# Patient Record
Sex: Male | Born: 1955 | Race: White | Hispanic: No | Marital: Single | Smoking: Current every day smoker
Health system: Southern US, Community
[De-identification: ages and names within clinical notes are randomized; demographics above are authoritative.]

## PROBLEM LIST (undated history)

## (undated) DIAGNOSIS — G4700 Insomnia, unspecified: Secondary | ICD-10-CM

## (undated) DIAGNOSIS — F419 Anxiety disorder, unspecified: Secondary | ICD-10-CM

## (undated) DIAGNOSIS — Z720 Tobacco use: Secondary | ICD-10-CM

## (undated) DIAGNOSIS — K219 Gastro-esophageal reflux disease without esophagitis: Secondary | ICD-10-CM

## (undated) DIAGNOSIS — F329 Major depressive disorder, single episode, unspecified: Secondary | ICD-10-CM

## (undated) DIAGNOSIS — K648 Other hemorrhoids: Secondary | ICD-10-CM

## (undated) DIAGNOSIS — F32A Depression, unspecified: Secondary | ICD-10-CM

## (undated) DIAGNOSIS — J439 Emphysema, unspecified: Secondary | ICD-10-CM

## (undated) DIAGNOSIS — F1411 Cocaine abuse, in remission: Secondary | ICD-10-CM

## (undated) DIAGNOSIS — I700 Atherosclerosis of aorta: Secondary | ICD-10-CM

## (undated) DIAGNOSIS — J449 Chronic obstructive pulmonary disease, unspecified: Secondary | ICD-10-CM

## (undated) DIAGNOSIS — D126 Benign neoplasm of colon, unspecified: Secondary | ICD-10-CM

## (undated) DIAGNOSIS — E785 Hyperlipidemia, unspecified: Secondary | ICD-10-CM

## (undated) DIAGNOSIS — M1990 Unspecified osteoarthritis, unspecified site: Secondary | ICD-10-CM

## (undated) DIAGNOSIS — Z860102 Personal history of hyperplastic colon polyps: Secondary | ICD-10-CM

## (undated) DIAGNOSIS — K5900 Constipation, unspecified: Secondary | ICD-10-CM

## (undated) DIAGNOSIS — N529 Male erectile dysfunction, unspecified: Secondary | ICD-10-CM

## (undated) HISTORY — DX: Hyperlipidemia, unspecified: E78.5

## (undated) HISTORY — DX: Emphysema, unspecified: J43.9

## (undated) HISTORY — DX: Benign neoplasm of colon, unspecified: D12.6

## (undated) HISTORY — DX: Tobacco use: Z72.0

## (undated) HISTORY — PX: LEG SURGERY: SHX1003

## (undated) HISTORY — DX: Personal history of hyperplastic colon polyps: Z86.0102

## (undated) HISTORY — DX: Chronic obstructive pulmonary disease, unspecified: J44.9

## (undated) HISTORY — DX: Depression, unspecified: F32.A

## (undated) HISTORY — DX: Anxiety disorder, unspecified: F41.9

## (undated) HISTORY — DX: Atherosclerosis of aorta: I70.0

## (undated) HISTORY — PX: OTHER SURGICAL HISTORY: SHX169

## (undated) HISTORY — DX: Major depressive disorder, single episode, unspecified: F32.9

## (undated) HISTORY — DX: Male erectile dysfunction, unspecified: N52.9

## (undated) HISTORY — DX: Insomnia, unspecified: G47.00

## (undated) HISTORY — DX: Cocaine abuse, in remission: F14.11

---

## 1999-07-12 HISTORY — PX: APPENDECTOMY: SHX54

## 2010-08-01 ENCOUNTER — Encounter: Payer: Self-pay | Admitting: Family Medicine

## 2014-03-26 ENCOUNTER — Encounter (HOSPITAL_BASED_OUTPATIENT_CLINIC_OR_DEPARTMENT_OTHER): Payer: Self-pay | Admitting: *Deleted

## 2014-03-26 ENCOUNTER — Other Ambulatory Visit (INDEPENDENT_AMBULATORY_CARE_PROVIDER_SITE_OTHER): Payer: Self-pay | Admitting: General Surgery

## 2014-03-28 ENCOUNTER — Encounter (HOSPITAL_BASED_OUTPATIENT_CLINIC_OR_DEPARTMENT_OTHER): Payer: Self-pay | Admitting: *Deleted

## 2014-03-28 NOTE — Progress Notes (Signed)
NPO AFTER MN. ARRIVE AT 0930. NEEDS HG. MAY TAKE TRAMADOL IF NEEDED AM DOS W/ SIPS OF WATER.

## 2014-04-02 ENCOUNTER — Ambulatory Visit (HOSPITAL_BASED_OUTPATIENT_CLINIC_OR_DEPARTMENT_OTHER)
Admission: RE | Admit: 2014-04-02 | Discharge: 2014-04-02 | Disposition: A | Discharge status: Home / Self Care | Payer: Managed Care, Other (non HMO) | Source: Ambulatory Visit | Attending: General Surgery | Admitting: General Surgery

## 2014-04-02 ENCOUNTER — Encounter (HOSPITAL_BASED_OUTPATIENT_CLINIC_OR_DEPARTMENT_OTHER): Payer: Self-pay | Admitting: Certified Registered"

## 2014-04-02 ENCOUNTER — Encounter (HOSPITAL_BASED_OUTPATIENT_CLINIC_OR_DEPARTMENT_OTHER): Payer: Managed Care, Other (non HMO) | Admitting: Anesthesiology

## 2014-04-02 ENCOUNTER — Encounter (HOSPITAL_BASED_OUTPATIENT_CLINIC_OR_DEPARTMENT_OTHER)
Admission: RE | Disposition: A | Discharge status: Home / Self Care | Payer: Self-pay | Source: Ambulatory Visit | Attending: General Surgery

## 2014-04-02 ENCOUNTER — Ambulatory Visit (HOSPITAL_BASED_OUTPATIENT_CLINIC_OR_DEPARTMENT_OTHER): Payer: Managed Care, Other (non HMO) | Admitting: Anesthesiology

## 2014-04-02 DIAGNOSIS — F17200 Nicotine dependence, unspecified, uncomplicated: Secondary | ICD-10-CM | POA: Insufficient documentation

## 2014-04-02 DIAGNOSIS — K648 Other hemorrhoids: Secondary | ICD-10-CM | POA: Insufficient documentation

## 2014-04-02 HISTORY — DX: Constipation, unspecified: K59.00

## 2014-04-02 HISTORY — DX: Unspecified osteoarthritis, unspecified site: M19.90

## 2014-04-02 HISTORY — DX: Gastro-esophageal reflux disease without esophagitis: K21.9

## 2014-04-02 HISTORY — PX: HEMORRHOID SURGERY: SHX153

## 2014-04-02 HISTORY — DX: Other hemorrhoids: K64.8

## 2014-04-02 LAB — POCT HEMOGLOBIN-HEMACUE: Hemoglobin: 17.5 g/dL — ABNORMAL HIGH (ref 13.0–17.0)

## 2014-04-02 SURGERY — HEMORRHOIDECTOMY
Anesthesia: Monitor Anesthesia Care | Site: Rectum

## 2014-04-02 MED ORDER — SODIUM CHLORIDE 0.9 % IJ SOLN
3.0000 mL | Freq: Two times a day (BID) | INTRAMUSCULAR | Status: DC
  Filled 2014-04-02: qty 3

## 2014-04-02 MED ORDER — LACTATED RINGERS IV SOLN
INTRAVENOUS | Status: DC
  Administered 2014-04-02 (×2): via INTRAVENOUS
  Filled 2014-04-02: qty 1000

## 2014-04-02 MED ORDER — OXYCODONE HCL 5 MG PO TABS
5.0000 mg | ORAL_TABLET | ORAL | Status: DC | PRN
  Filled 2014-04-02: qty 2

## 2014-04-02 MED ORDER — PROMETHAZINE HCL 25 MG/ML IJ SOLN
6.2500 mg | INTRAMUSCULAR | Status: DC | PRN
  Filled 2014-04-02: qty 1

## 2014-04-02 MED ORDER — PROPOFOL 10 MG/ML IV EMUL
INTRAVENOUS | Status: DC | PRN
  Administered 2014-04-02: 200 ug/kg/min via INTRAVENOUS

## 2014-04-02 MED ORDER — KETAMINE HCL 50 MG/ML IJ SOLN
INTRAMUSCULAR | Status: AC
  Filled 2014-04-02: qty 10

## 2014-04-02 MED ORDER — FENTANYL CITRATE 0.05 MG/ML IJ SOLN
INTRAMUSCULAR | Status: AC
  Filled 2014-04-02: qty 6

## 2014-04-02 MED ORDER — MIDAZOLAM HCL 2 MG/2ML IJ SOLN
INTRAMUSCULAR | Status: AC
  Filled 2014-04-02: qty 2

## 2014-04-02 MED ORDER — MIDAZOLAM HCL 5 MG/5ML IJ SOLN
INTRAMUSCULAR | Status: DC | PRN
  Administered 2014-04-02: 2 mg via INTRAVENOUS

## 2014-04-02 MED ORDER — BUPIVACAINE-EPINEPHRINE 0.5% -1:200000 IJ SOLN
INTRAMUSCULAR | Status: DC | PRN
  Administered 2014-04-02: 10 mL

## 2014-04-02 MED ORDER — FENTANYL CITRATE 0.05 MG/ML IJ SOLN
25.0000 ug | INTRAMUSCULAR | Status: DC | PRN
  Filled 2014-04-02: qty 1

## 2014-04-02 MED ORDER — SODIUM CHLORIDE 0.9 % IJ SOLN
3.0000 mL | INTRAMUSCULAR | Status: DC | PRN
  Filled 2014-04-02: qty 3

## 2014-04-02 MED ORDER — ACETAMINOPHEN 650 MG RE SUPP
650.0000 mg | RECTAL | Status: DC | PRN
  Filled 2014-04-02: qty 1

## 2014-04-02 MED ORDER — KETAMINE HCL 100 MG/ML IJ SOLN
250.0000 mg | INTRAMUSCULAR | Status: DC | PRN
  Administered 2014-04-02: 15 ug/kg/min via INTRAVENOUS

## 2014-04-02 MED ORDER — SODIUM CHLORIDE 0.9 % IV SOLN
250.0000 mL | INTRAVENOUS | Status: DC | PRN
  Filled 2014-04-02: qty 250

## 2014-04-02 MED ORDER — KETOROLAC TROMETHAMINE 30 MG/ML IJ SOLN
15.0000 mg | Freq: Once | INTRAMUSCULAR | Status: DC | PRN
  Filled 2014-04-02: qty 1

## 2014-04-02 MED ORDER — FENTANYL CITRATE 0.05 MG/ML IJ SOLN
INTRAMUSCULAR | Status: DC | PRN
  Administered 2014-04-02: 50 ug via INTRAVENOUS
  Administered 2014-04-02: 25 ug via INTRAVENOUS

## 2014-04-02 MED ORDER — SODIUM CHLORIDE 0.9 % IR SOLN
Status: DC | PRN
  Administered 2014-04-02: 500 mL

## 2014-04-02 MED ORDER — ACETAMINOPHEN 325 MG PO TABS
650.0000 mg | ORAL_TABLET | ORAL | Status: DC | PRN
  Filled 2014-04-02: qty 2

## 2014-04-02 MED ORDER — OXYCODONE-ACETAMINOPHEN 5-325 MG PO TABS: 1.0000 | ORAL_TABLET | ORAL | Status: DC | PRN

## 2014-04-02 MED ORDER — LIDOCAINE HCL (CARDIAC) 20 MG/ML IV SOLN
INTRAVENOUS | Status: DC | PRN
  Administered 2014-04-02: 50 mg via INTRAVENOUS

## 2014-04-02 SURGICAL SUPPLY — 38 items
BLADE HEX COATED 2.75 (ELECTRODE) IMPLANT
BLADE SURG 10 STRL SS (BLADE) ×2 IMPLANT
BRIEF STRETCH FOR OB PAD LRG (UNDERPADS AND DIAPERS) IMPLANT
CLOTH BEACON ORANGE TIMEOUT ST (SAFETY) ×2 IMPLANT
COVER MAYO STAND STRL (DRAPES) ×2 IMPLANT
COVER TABLE BACK 60X90 (DRAPES) ×2 IMPLANT
DRAPE PED LAPAROTOMY (DRAPES) ×2 IMPLANT
DRAPE UTILITY XL STRL (DRAPES) ×2 IMPLANT
DRSG PAD ABDOMINAL 8X10 ST (GAUZE/BANDAGES/DRESSINGS) ×2 IMPLANT
ELECT BLADE 6.5 .24CM SHAFT (ELECTRODE) ×2 IMPLANT
ELECT REM PT RETURN 9FT ADLT (ELECTROSURGICAL) ×2
ELECTRODE REM PT RTRN 9FT ADLT (ELECTROSURGICAL) ×1 IMPLANT
GAUZE SPONGE 4X4 16PLY XRAY LF (GAUZE/BANDAGES/DRESSINGS) ×2 IMPLANT
GLOVE BIO SURGEON STRL SZ 6.5 (GLOVE) ×4 IMPLANT
GLOVE INDICATOR 7.0 STRL GRN (GLOVE) ×2 IMPLANT
GOWN STRL REUS W/TWL 2XL LVL3 (GOWN DISPOSABLE) ×2 IMPLANT
NEEDLE HYPO 22GX1.5 SAFETY (NEEDLE) ×2 IMPLANT
NEEDLE HYPO 25X1 1.5 SAFETY (NEEDLE) ×2 IMPLANT
NS IRRIG 500ML POUR BTL (IV SOLUTION) ×2 IMPLANT
PACK BASIN DAY SURGERY FS (CUSTOM PROCEDURE TRAY) ×2 IMPLANT
PAD ABD 8X10 STRL (GAUZE/BANDAGES/DRESSINGS) IMPLANT
PENCIL BUTTON HOLSTER BLD 10FT (ELECTRODE) ×2 IMPLANT
SPONGE GAUZE 4X4 12PLY (GAUZE/BANDAGES/DRESSINGS) ×2 IMPLANT
SPONGE GAUZE 4X4 12PLY STER LF (GAUZE/BANDAGES/DRESSINGS) ×2 IMPLANT
SPONGE SURGIFOAM ABS GEL 100 (HEMOSTASIS) IMPLANT
SPONGE SURGIFOAM ABS GEL 12-7 (HEMOSTASIS) IMPLANT
STAPLER VISISTAT 35W (STAPLE) ×2 IMPLANT
SUT CHROMIC 2 0 SH (SUTURE) ×4 IMPLANT
SUT GUT CHROMIC 3 0 (SUTURE) IMPLANT
SUT PROLENE 2 0 BLUE (SUTURE) IMPLANT
SUT VIC AB 4-0 P-3 18XBRD (SUTURE) IMPLANT
SUT VIC AB 4-0 P3 18 (SUTURE)
SUT VIC AB 4-0 SH 18 (SUTURE) IMPLANT
SYR CONTROL 10ML LL (SYRINGE) ×2 IMPLANT
TRAY DSU PREP LF (CUSTOM PROCEDURE TRAY) ×2 IMPLANT
TUBE CONNECTING 12X1/4 (SUCTIONS) ×2 IMPLANT
WATER STERILE IRR 500ML POUR (IV SOLUTION) IMPLANT
YANKAUER SUCT BULB TIP NO VENT (SUCTIONS) ×2 IMPLANT

## 2014-04-02 NOTE — Anesthesia Postprocedure Evaluation (Signed)
  Anesthesia Post-op Note  Patient: Mitchell Dean  Procedure(s) Performed: Procedure(s) (LRB): EXCISION OF INTERNAL HEMORRHOIDS (N/A)  Patient Location: PACU  Anesthesia Type: General  Level of Consciousness: awake and alert   Airway and Oxygen Therapy: Patient Spontanous Breathing  Post-op Pain: mild  Post-op Assessment: Post-op Vital signs reviewed, Patient's Cardiovascular Status Stable, Respiratory Function Stable, Patent Airway and No signs of Nausea or vomiting  Last Vitals:  Filed Vitals:   04/02/14 1145  BP: 119/94  Pulse: 86  Temp:   Resp: 10    Post-op Vital Signs: stable   Complications: No apparent anesthesia complications

## 2014-04-02 NOTE — Anesthesia Preprocedure Evaluation (Signed)
Anesthesia Evaluation  Patient identified by MRN, date of birth, ID band Patient awake    Reviewed: Allergy & Precautions, H&P , NPO status , Patient's Chart, lab work & pertinent test results  Airway Mallampati: II TM Distance: >3 FB Neck ROM: Full    Dental no notable dental hx.    Pulmonary Current Smoker,  breath sounds clear to auscultation  Pulmonary exam normal       Cardiovascular negative cardio ROS  Rhythm:Regular Rate:Normal     Neuro/Psych negative neurological ROS  negative psych ROS   GI/Hepatic negative GI ROS, Neg liver ROS,   Endo/Other  negative endocrine ROS  Renal/GU negative Renal ROS  negative genitourinary   Musculoskeletal negative musculoskeletal ROS (+)   Abdominal   Peds negative pediatric ROS (+)  Hematology negative hematology ROS (+)   Anesthesia Other Findings   Reproductive/Obstetrics negative OB ROS                           Anesthesia Physical Anesthesia Plan  ASA: II  Anesthesia Plan: MAC   Post-op Pain Management:    Induction: Intravenous  Airway Management Planned: Simple Face Mask  Additional Equipment:   Intra-op Plan:   Post-operative Plan:   Informed Consent: I have reviewed the patients History and Physical, chart, labs and discussed the procedure including the risks, benefits and alternatives for the proposed anesthesia with the patient or authorized representative who has indicated his/her understanding and acceptance.   Dental advisory given  Plan Discussed with: CRNA and Surgeon  Anesthesia Plan Comments:         Anesthesia Quick Evaluation

## 2014-04-02 NOTE — Addendum Note (Signed)
Addendum created 04/02/14 1313 by Suan Halter, CRNA   Modules edited: Charges VN

## 2014-04-02 NOTE — Discharge Instructions (Addendum)
ANORECTAL SURGERY: POST OP INSTRUCTIONS °1. Take your usually prescribed home medications unless otherwise directed. °2. DIET: During the first few hours after surgery sip on some liquids until you are able to urinate.  It is normal to not urinate for several hours after this surgery.  If you feel uncomfortable, please contact the office for instructions.  After you are able to urinate,you may eat, if you feel like it.  Follow a light bland diet the first 24 hours after arrival home, such as soup, liquids, crackers, etc.  Be sure to include lots of fluids daily (6-8 glasses).  Avoid fast food or heavy meals, as your are more likely to get nauseated.  Eat a low fat diet the next few days after surgery.  Limit caffeine intake to 1-2 servings a day. °3. PAIN CONTROL: °a. Pain is best controlled by a usual combination of several different methods TOGETHER: °i. Muscle relaxation: Soak in a warm bath (or Sitz bath) three times a day and after bowel movements.  Continue to do this until all pain is resolved. °ii. Over the counter pain medication °iii. Prescription pain medication °b. Most patients will experience some swelling and discomfort in the anus/rectal area and incisions.  Heat such as warm towels, sitz baths, warm baths, etc to help relax tight/sore spots and speed recovery.  Some people prefer to use ice, especially in the first couple days after surgery, as it may decrease the pain and swelling, or alternate between ice & heat.  Experiment to what works for you.  Swelling and bruising can take several weeks to resolve.  Pain can take even longer to completely resolve. °c. It is helpful to take an over-the-counter pain medication regularly for the first few weeks.  Choose one of the following that works best for you: °i. Naproxen (Aleve, etc)  Two 220mg tabs twice a day °ii. Ibuprofen (Advil, etc) Three 200mg tabs four times a day (every meal & bedtime) °d. A  prescription for pain medication (such as percocet,  oxycodone, hydrocodone, etc) should be given to you upon discharge.  Take your pain medication as prescribed.  °i. If you are having problems/concerns with the prescription medicine (does not control pain, nausea, vomiting, rash, itching, etc), please call us [phone] to see if we need to switch you to a different pain medicine that will work better for you and/or control your side effect better. °ii. If you need a refill on your pain medication, please contact your pharmacy.  They will contact our office to request authorization. Prescriptions will not be filled after 5 pm or on week-ends. °4. KEEP YOUR BOWELS REGULAR and AVOID CONSTIPATION °a. The goal is one to two soft bowel movements a day.  You should at least have a bowel movement every other day. °b. Avoid getting constipated.  Between the surgery and the pain medications, it is common to experience some constipation. This can be very painful after rectal surgery.  Increasing fluid intake and taking a fiber supplement (such as Metamucil, Citrucel, FiberCon, etc) 1-2 times a day regularly will usually help prevent this problem from occurring.  A stool softener like colace is also recommended.  This can be purchased over the counter at your pharmacy.  You can take it up to 3 times a day.  If you do not have a bowel movement after 24 hrs since your surgery, take one does of milk of magnesia.  If you still haven't had a bowel movement 8-12 hours after   that dose, take another dose.  If you don't have a bowel movement 48 hrs after surgery, purchase a Fleets enema from the drug store and administer gently per package instructions.  If you still are having trouble with your bowel movements after that, please call the office for further instructions. °c. If you develop diarrhea or have many loose bowel movements, simplify your diet to bland foods & liquids for a few days.  Stop any stool softeners and decrease your fiber supplement.  Switching to mild  anti-diarrheal medications (Kayopectate, Pepto Bismol) can help.  If this worsens or does not improve, please call us. ° °5. Wound Care °a. Remove your bandages before your first bowel movement or 8 hours after surgery.     °b. Remove any wound packing material at this tim,e as well.  You do not need to repack the wound unless instructed otherwise.  Wear an absorbent pad or soft cotton gauze in your underwear to catch any drainage and help keep the area clean. You should change this every 2-3 hours while awake. °c. Keep the area clean and dry.  Bathe / shower every day, especially after bowel movements.  Keep the area clean by showering / bathing over the incision / wound.   It is okay to soak an open wound to help wash it.  Wet wipes or showers / gentle washing after bowel movements is often less traumatic than regular toilet paper. °d. You may have some styrofoam-like soft packing in the rectum which will come out with the first bowel movement.  °e. You will often notice bleeding with bowel movements.  This should slow down by the end of the first week of surgery °f. Expect some drainage.  This should slow down, too, by the end of the first week of surgery.  Wear an absorbent pad or soft cotton gauze in your underwear until the drainage stops. °g. Do Not sit on a rubber or pillow ring.  This can make you symptoms worse.  You may sit on a soft pillow if needed.  °6. ACTIVITIES as tolerated:   °a. You may resume regular (light) daily activities beginning the next day--such as daily self-care, walking, climbing stairs--gradually increasing activities as tolerated.  If you can walk 30 minutes without difficulty, it is safe to try more intense activity such as jogging, treadmill, bicycling, low-impact aerobics, swimming, etc. °b. Save the most intensive and strenuous activity for last such as sit-ups, heavy lifting, contact sports, etc  Refrain from any heavy lifting or straining until you are off narcotics for pain  control.   °c. You may drive when you are no longer taking prescription pain medication, you can comfortably sit for long periods of time, and you can safely maneuver your car and apply brakes. °d. You may have sexual intercourse when it is comfortable.  °7. FOLLOW UP in our office °a. Please call [facility] at [phone] to set up an appointment to see your surgeon in the office for a follow-up appointment approximately 3-4 weeks after your surgery. °b. Make sure that you call for this appointment the day you arrive home to insure a convenient appointment time. °10. IF YOU HAVE DISABILITY OR FAMILY LEAVE FORMS, BRING THEM TO THE OFFICE FOR PROCESSING.  DO NOT GIVE THEM TO YOUR DOCTOR. ° ° ° ° °WHEN TO CALL US [phone]: °1. Poor pain control °2. Reactions / problems with new medications (rash/itching, nausea, etc)  °3. Fever over 101.5 F (38.5 C) °4.   Inability to urinate 5. Nausea and/or vomiting 6. Worsening swelling or bruising 7. Continued bleeding from incision. 8. Increased pain, redness, or drainage from the incision  The clinic staff is available to answer your questions during regular business hours (8:30am-5pm).  Please dont hesitate to call and ask to speak to one of our nurses for clinical concerns.   A surgeon from [facility] is always on call at the hospitals   If you have a medical emergency, go to the nearest emergency room or call 911.    [facility], [place], [place], [place], [place]  [address] ? MAIN: [phone] ? TOLL FREE: [phone] ? FAX [phone] [email or web address]  Information for Discharge Teaching: EXPAREL (bupivacaine liposome injectable suspension)   Your surgeon gave you EXPAREL(bupivacaine) in your surgical incision to help control your pain after surgery.   EXPAREL is a local anesthetic that provides pain relief by numbing the tissue around the surgical site.  EXPAREL is designed to release  pain medication over time and can control pain for up to 72 hours.  Depending on how you respond to EXPAREL, you may require less pain medication during your recovery.  Possible side effects:  Temporary loss of sensation or ability to move in the area where bupivacaine was injected.  Nausea, vomiting, constipation  Rarely, numbness and tingling in your mouth or lips, lightheadedness, or anxiety may occur.  Call your doctor right away if you think you may be experiencing any of these sensations, or if you have other questions regarding possible side effects.  Follow all other discharge instructions given to you by your surgeon or nurse. Eat a healthy diet and drink plenty of water or other fluids.  If you return to the hospital for any reason within 96 hours following the administration of EXPAREL, please inform your health care providers.    Post Anesthesia Home Care Instructions  Activity: Get plenty of rest for the remainder of the day. A responsible adult should stay with you for 24 hours following the procedure.  For the next 24 hours, DO NOT: -Drive a car -Paediatric nurse -Drink alcoholic beverages -Take any medication unless instructed by your physician -Make any legal decisions or sign important papers.  Meals: Start with liquid foods such as gelatin or soup. Progress to regular foods as tolerated. Avoid greasy, spicy, heavy foods. If nausea and/or vomiting occur, drink only clear liquids until the nausea and/or vomiting subsides. Call your physician if vomiting continues.  Special Instructions/Symptoms: Your throat may feel dry or sore from the anesthesia or the breathing tube placed in your throat during surgery. If this causes discomfort, gargle with warm salt water. The discomfort should disappear within 24 hours.

## 2014-04-02 NOTE — Op Note (Signed)
04/02/2014  11:27 AM  PATIENT:  Mitchell Dean  57 y.o. male  Patient Care Team: Reginia Naas, MD as PCP - General (Family Medicine)  PRE-OPERATIVE DIAGNOSIS:  grade 3 internal hemorrhoid  POST-OPERATIVE DIAGNOSIS:  grade 3 internal hemorrhoid  PROCEDURE:  ANAL EXAM UNDER ANESTHESIA, SINGLE COLUMN HEMORRHOID EXCISION  SURGEON:  Surgeon(s): Leighton Ruff, MD  ASSISTANT: none   ANESTHESIA:   local and MAC  SPECIMEN:  Source of Specimen:  R posterior hemorrhoid  DISPOSITION OF SPECIMEN:  PATHOLOGY  COUNTS:  YES  PLAN OF CARE: Discharge to home after PACU  PATIENT DISPOSITION:  PACU - hemodynamically stable.  INDICATION: This is a 58 year old male who presented to my office with acute pain and a grade 3 prolapsed internal hemorrhoid. This was associated with a moderate-sized fibrotic skin tag.  I recommended excision under anesthesia. The risk and benefits of the procedure were explained. He agreed and consent was signed and placed on the chart prior to the OR.   OR FINDINGS: Grade 3 prolapsed internal hemorrhoid with external skin tag component. Grade 1 left lateral internal hemorrhoid, grade 2 right anterior internal hemorrhoid  DESCRIPTION: the patient was identified in the preoperative holding area and taken to the OR where they were laid on the operating room table.  MAC anesthesia was induced without difficulty. The patient was then positioned in prone jackknife position with buttocks gently taped apart.  The patient was then prepped and draped in usual sterile fashion.  SCDs were noted to be in place prior to the initiation of anesthesia. A surgical timeout was performed indicating the correct patient, procedure, positioning and need for preoperative antibiotics.  A rectal block was performed using 0.5% Marcaine with epinephrine.  I began with a digital rectal exam.  There were no palpable masses. There were no areas of thrombosis.  I then placed a Hill-Ferguson  anoscope into the anal canal and evaluated this completely.  A grade 1 left lateral internal hemorrhoid was identified. A grade 2 right anterior internal hemorrhoid was identified. These did not appear to be inflamed. A grade 3 right posterior hemorrhoid was identified with an external skin tag associated with this. I used Metzenbaum scissors to divide the skin down to the level of the dentate line making sure to dissect away any muscle fibers of the sphincter. I then placed a Kelly clamp around the internal component of the internal hemorrhoid and excised the hemorrhoid using a 10 blade scalpel.  This was sent to pathology for further examination.  I used a 2-0 chromic suture in a running fashion to close the internal component of the incision. After this was completed I used a 3-0 running chromic suture to close the external component of the incision (distal to the dentate line). Hemostasis was good. I then performed a rectal block using Exparel. A sterile dressing was applied. The patient was awakened from anesthesia and sent to the postanesthesia care unit in stable condition. All counts were correct per operating room staff.

## 2014-04-02 NOTE — Transfer of Care (Signed)
Immediate Anesthesia Transfer of Care Note  Patient: Mitchell Dean  Procedure(s) Performed: Procedure(s) (LRB): EXCISION OF INTERNAL HEMORRHOIDS (N/A)  Patient Location: PACU  Anesthesia Type: MAC  Level of Consciousness: awake, alert , oriented and patient cooperative  Airway & Oxygen Therapy: Patient Spontanous Breathing and Patient connected to face mask oxygen  Post-op Assessment: Report given to PACU RN and Post -op Vital signs reviewed and stable  Post vital signs: Reviewed and stable  Complications: No apparent anesthesia complications

## 2014-04-03 ENCOUNTER — Encounter (HOSPITAL_BASED_OUTPATIENT_CLINIC_OR_DEPARTMENT_OTHER): Payer: Self-pay | Admitting: General Surgery

## 2016-07-14 DIAGNOSIS — H6982 Other specified disorders of Eustachian tube, left ear: Secondary | ICD-10-CM | POA: Diagnosis not present

## 2016-08-02 DIAGNOSIS — H6982 Other specified disorders of Eustachian tube, left ear: Secondary | ICD-10-CM | POA: Diagnosis not present

## 2016-08-03 ENCOUNTER — Encounter (HOSPITAL_COMMUNITY): Payer: Self-pay | Admitting: Emergency Medicine

## 2016-08-03 ENCOUNTER — Observation Stay (HOSPITAL_COMMUNITY)
Admission: EM | Admit: 2016-08-03 | Discharge: 2016-08-05 | Disposition: A | Discharge status: Home / Self Care | Payer: BLUE CROSS/BLUE SHIELD | Attending: Family Medicine | Admitting: Family Medicine

## 2016-08-03 ENCOUNTER — Emergency Department (HOSPITAL_COMMUNITY): Payer: BLUE CROSS/BLUE SHIELD

## 2016-08-03 DIAGNOSIS — J441 Chronic obstructive pulmonary disease with (acute) exacerbation: Principal | ICD-10-CM | POA: Insufficient documentation

## 2016-08-03 DIAGNOSIS — F17210 Nicotine dependence, cigarettes, uncomplicated: Secondary | ICD-10-CM | POA: Insufficient documentation

## 2016-08-03 DIAGNOSIS — Z91041 Radiographic dye allergy status: Secondary | ICD-10-CM | POA: Insufficient documentation

## 2016-08-03 DIAGNOSIS — M19042 Primary osteoarthritis, left hand: Secondary | ICD-10-CM | POA: Diagnosis not present

## 2016-08-03 DIAGNOSIS — Z825 Family history of asthma and other chronic lower respiratory diseases: Secondary | ICD-10-CM | POA: Insufficient documentation

## 2016-08-03 DIAGNOSIS — G8929 Other chronic pain: Secondary | ICD-10-CM | POA: Diagnosis not present

## 2016-08-03 DIAGNOSIS — R0602 Shortness of breath: Secondary | ICD-10-CM | POA: Diagnosis not present

## 2016-08-03 DIAGNOSIS — M19041 Primary osteoarthritis, right hand: Secondary | ICD-10-CM | POA: Insufficient documentation

## 2016-08-03 DIAGNOSIS — F17200 Nicotine dependence, unspecified, uncomplicated: Secondary | ICD-10-CM

## 2016-08-03 DIAGNOSIS — K219 Gastro-esophageal reflux disease without esophagitis: Secondary | ICD-10-CM | POA: Diagnosis not present

## 2016-08-03 DIAGNOSIS — F419 Anxiety disorder, unspecified: Secondary | ICD-10-CM | POA: Insufficient documentation

## 2016-08-03 DIAGNOSIS — F329 Major depressive disorder, single episode, unspecified: Secondary | ICD-10-CM | POA: Diagnosis not present

## 2016-08-03 DIAGNOSIS — M549 Dorsalgia, unspecified: Secondary | ICD-10-CM | POA: Diagnosis not present

## 2016-08-03 DIAGNOSIS — R0902 Hypoxemia: Secondary | ICD-10-CM | POA: Diagnosis not present

## 2016-08-03 DIAGNOSIS — K5900 Constipation, unspecified: Secondary | ICD-10-CM | POA: Diagnosis not present

## 2016-08-03 DIAGNOSIS — Z79899 Other long term (current) drug therapy: Secondary | ICD-10-CM | POA: Diagnosis not present

## 2016-08-03 LAB — BASIC METABOLIC PANEL
Anion gap: 11 (ref 5–15)
BUN: 14 mg/dL (ref 6–20)
CALCIUM: 9.5 mg/dL (ref 8.9–10.3)
CO2: 27 mmol/L (ref 22–32)
CREATININE: 1.19 mg/dL (ref 0.61–1.24)
Chloride: 101 mmol/L (ref 101–111)
GFR calc non Af Amer: >60 mL/min (ref >60)
Glucose, Bld: 110 mg/dL — ABNORMAL HIGH (ref 65–99)
Potassium: 3.7 mmol/L (ref 3.5–5.1)
SODIUM: 139 mmol/L (ref 135–145)

## 2016-08-03 LAB — CBC WITH DIFFERENTIAL/PLATELET
BASOS PCT: 0 %
Basophils Absolute: 0 10*3/uL (ref 0.0–0.1)
EOS PCT: 0 %
Eosinophils Absolute: 0 10*3/uL (ref 0.0–0.7)
HCT: 54.4 % — ABNORMAL HIGH (ref 39.0–52.0)
Hemoglobin: 19.5 g/dL — ABNORMAL HIGH (ref 13.0–17.0)
Lymphocytes Relative: 9 %
Lymphs Abs: 1.4 10*3/uL (ref 0.7–4.0)
MCH: 32.2 pg (ref 26.0–34.0)
MCHC: 35.8 g/dL (ref 30.0–36.0)
MCV: 89.9 fL (ref 78.0–100.0)
MONO ABS: 1 10*3/uL (ref 0.1–1.0)
MONOS PCT: 6 %
Neutro Abs: 13.6 10*3/uL — ABNORMAL HIGH (ref 1.7–7.7)
Neutrophils Relative %: 85 %
PLATELETS: 177 10*3/uL (ref 150–400)
RBC: 6.05 MIL/uL — ABNORMAL HIGH (ref 4.22–5.81)
RDW: 13.9 % (ref 11.5–15.5)
WBC: 16 10*3/uL — ABNORMAL HIGH (ref 4.0–10.5)

## 2016-08-03 LAB — I-STAT TROPONIN, ED: TROPONIN I, POC: 0.01 ng/mL (ref 0.00–0.08)

## 2016-08-03 LAB — BRAIN NATRIURETIC PEPTIDE: B NATRIURETIC PEPTIDE 5: 27.9 pg/mL (ref 0.0–100.0)

## 2016-08-03 MED ORDER — ALBUTEROL (5 MG/ML) CONTINUOUS INHALATION SOLN
INHALATION_SOLUTION | RESPIRATORY_TRACT | Status: AC
  Filled 2016-08-03: qty 20

## 2016-08-03 MED ORDER — ALBUTEROL SULFATE (2.5 MG/3ML) 0.083% IN NEBU
5.0000 mg | INHALATION_SOLUTION | Freq: Once | RESPIRATORY_TRACT | Status: AC
  Administered 2016-08-03: 5 mg via RESPIRATORY_TRACT
  Filled 2016-08-03: qty 6

## 2016-08-03 MED ORDER — SODIUM CHLORIDE 0.9 % IV BOLUS (SEPSIS)
1000.0000 mL | Freq: Once | INTRAVENOUS | Status: AC
  Administered 2016-08-03: 1000 mL via INTRAVENOUS

## 2016-08-03 MED ORDER — ALBUTEROL (5 MG/ML) CONTINUOUS INHALATION SOLN
10.0000 mg/h | INHALATION_SOLUTION | Freq: Once | RESPIRATORY_TRACT | Status: AC
  Administered 2016-08-03: 10 mg/h via RESPIRATORY_TRACT

## 2016-08-03 MED ORDER — MAGNESIUM SULFATE 2 GM/50ML IV SOLN
2.0000 g | Freq: Once | INTRAVENOUS | Status: AC
  Administered 2016-08-03: 2 g via INTRAVENOUS
  Filled 2016-08-03: qty 50

## 2016-08-03 MED ORDER — IPRATROPIUM-ALBUTEROL 0.5-2.5 (3) MG/3ML IN SOLN
RESPIRATORY_TRACT | Status: AC
  Administered 2016-08-03: 3 mL
  Filled 2016-08-03: qty 3

## 2016-08-03 MED ORDER — METHYLPREDNISOLONE SODIUM SUCC 125 MG IJ SOLR
125.0000 mg | Freq: Once | INTRAMUSCULAR | Status: AC
  Administered 2016-08-03: 125 mg via INTRAVENOUS
  Filled 2016-08-03: qty 2

## 2016-08-03 NOTE — ED Provider Notes (Signed)
[facility] DEPT Provider Note   CSN: [id number] Arrival date & time: 08/03/16  2059  By signing my name below, I, Julien Nordmann, attest that this documentation has been prepared under the direction and in the presence of Samadhi Mahurin, MD.  Electronically Signed: Julien Nordmann, ED Scribe. 08/03/16. 11:33 PM.    History   Chief Complaint Chief Complaint  Patient presents with  . Shortness of Breath  .  The history is provided by the patient. No language interpreter was used.  Shortness of Breath  This is a new problem. The problem occurs intermittently.The current episode started 12 to 24 hours ago. The problem has been gradually worsening. Associated symptoms include cough and wheezing. Pertinent negatives include no fever, no chest pain and no leg swelling. It is unknown what precipitated the problem. Risk factors include smoking. He has tried nothing for the symptoms. He has had no prior hospitalizations. He has had no prior ED visits. He has had no prior ICU admissions. Associated medical issues do not include PE or recent surgery.   HPI Comments: Mitchell Dean is a 61 y.o. male who presents to the Emergency Department complaining of acute onset, gradual worsening, shortness of breath that began ~ 12 hours ago. Pt has an associated productive cough and congestion.  He states his shortness of breath began this afternoon after lunch time. He notes being treated for an ear infection yesterday in which he was prescribed prednisone and amoxicillin. He is not on any home O2. Pt has not tried any treatments to alleviate his symptoms prior to coming to the ED. Pt is a smoker and reports smoking 1 pack a day. Denies leg swelling or chest pain.   Past Medical History:  Diagnosis Date  . Arthritis    hands  . Constipation   . Mild acid reflux   . Prolapsed internal hemorrhoids     There are no active problems to display for this patient.   Past Surgical History:  Procedure  Laterality Date  . APPENDECTOMY  2001  . HEMORRHOID SURGERY N/A 04/02/2014   Procedure: EXCISION OF INTERNAL HEMORRHOIDS;  Surgeon: Leighton Ruff, MD;  Location: [facility];  Service: General;  Laterality: N/A;  . I & D  RIGHT LEG INFECTION, EXTENSIVE  AGE 75       Home Medications    Prior to Admission medications   Medication Sig Start Date End Date Taking? Authorizing Provider  ALPRAZolam [name]) 1 MG tablet Take 1 mg by mouth 3 (three) times daily as needed for anxiety. 07/19/16  Yes Historical Provider, MD  amoxicillin (AMOXIL) 875 MG tablet Take 1 tablet by mouth every 12 (twelve) hours. 08/02/16  Yes Historical Provider, MD  FLUoxetine (PROZAC) 10 MG capsule Take 10 mg by mouth every morning. 07/17/16  Yes Historical Provider, MD  Multiple Vitamin (MULTIVITAMIN) tablet Take 1 tablet by mouth daily.   Yes Historical Provider, MD  polyethylene glycol (MIRALAX / GLYCOLAX) packet Take 17 g by mouth daily as needed for moderate constipation.   Yes Historical Provider, MD  predniSONE (DELTASONE) 20 MG tablet Take 40 mg by mouth daily with breakfast.   Yes Historical Provider, MD  zolpidem (AMBIEN) 10 MG tablet Take 10 mg by mouth at bedtime.   Yes Historical Provider, MD  oxyCODONE-acetaminophen (ROXICET) 5-325 MG per tablet Take 1-2 tablets by mouth every 4 (four) hours as needed for severe pain. Patient not taking: Reported on [id number] [id number]   Leighton Ruff, MD  Family History No family history on file.  Social History Social History  Substance Use Topics  . Smoking status: Current Every Day Smoker    Packs/day: 1.00    Years: 40.00    Types: Cigarettes  . Smokeless tobacco: Never Used  . Alcohol use Yes     Comment: OCCASIONAL     Allergies   Contrast media [iodinated diagnostic agents]   Review of Systems Review of Systems  Constitutional: Negative for fever.  Respiratory: Positive for cough, shortness of breath and wheezing.   Cardiovascular:  Negative for chest pain and leg swelling.  All other systems reviewed and are negative.    Physical Exam Updated Vital Signs BP 141/80   Pulse (!) 123   Temp 98.3 F (36.8 C) (Oral)   Resp 20   SpO2 94%   Physical Exam  Constitutional: He is oriented to person, place, and time. He appears well-developed and well-nourished.  HENT:  Head: Normocephalic and atraumatic.  Mouth/Throat: Oropharynx is clear and moist. No oropharyngeal exudate.  Moist mucous membranes. No exudates. Left ear is retracted  Eyes: Conjunctivae and EOM are normal. Pupils are equal, round, and reactive to light.  Neck: Normal range of motion. Neck supple. No JVD present. No tracheal deviation present.  No carotid bruits. Trachea midline. Super clavicular fat pads bilaterally  Cardiovascular: Regular rhythm, normal heart sounds and intact distal pulses.  Tachycardia present.  Exam reveals no gallop and no friction rub.   No murmur heard. RRR.   Pulmonary/Chest: No stridor. Tachypnea noted. He is in respiratory distress. He has wheezes. He has no rales. He exhibits no tenderness.  Expiratory and inspiratory wheezing, decreased breath sounds, tachypneic   Abdominal: Soft. Bowel sounds are normal. He exhibits no distension. There is no rebound and no guarding.  Musculoskeletal: Normal range of motion. He exhibits no edema or tenderness.  All compartments soft, no edema, distal pulses intact  Lymphadenopathy:    He has no cervical adenopathy.  Neurological: He is alert and oriented to person, place, and time. He has normal reflexes. He displays normal reflexes.  Skin: Skin is warm and dry. Capillary refill takes less than 2 seconds.  Psychiatric: He has a normal mood and affect.  Nursing note and vitals reviewed.    ED Treatments / Results   Vitals:   08/04/16 0000 08/04/16 0100  BP: 155/80 125/64  Pulse: 106 (!) 121  Resp: (!) 37 (!) 31  Temp:     Results for orders placed or performed during the  hospital encounter of 08/03/16  CBC with Differential/Platelet  Result Value Ref Range   WBC 16.0 (H) 4.0 - 10.5 K/uL   RBC 6.05 (H) 4.22 - 5.81 MIL/uL   Hemoglobin 19.5 (H) 13.0 - 17.0 g/dL   HCT 54.4 (H) 39.0 - 52.0 %   MCV 89.9 78.0 - 100.0 fL   MCH 32.2 26.0 - 34.0 pg   MCHC 35.8 30.0 - 36.0 g/dL   RDW 13.9 11.5 - 15.5 %   Platelets 177 150 - 400 K/uL   Neutrophils Relative % 85 %   Neutro Abs 13.6 (H) 1.7 - 7.7 K/uL   Lymphocytes Relative 9 %   Lymphs Abs 1.4 0.7 - 4.0 K/uL   Monocytes Relative 6 %   Monocytes Absolute 1.0 0.1 - 1.0 K/uL   Eosinophils Relative 0 %   Eosinophils Absolute 0.0 0.0 - 0.7 K/uL   Basophils Relative 0 %   Basophils Absolute 0.0 0.0 - 0.1 K/uL  Basic metabolic panel  Result Value Ref Range   Sodium 139 135 - 145 mmol/L   Potassium 3.7 3.5 - 5.1 mmol/L   Chloride 101 101 - 111 mmol/L   CO2 27 22 - 32 mmol/L   Glucose, Bld 110 (H) 65 - 99 mg/dL   BUN 14 6 - 20 mg/dL   Creatinine, Ser 1.19 0.61 - 1.24 mg/dL   Calcium 9.5 8.9 - 10.3 mg/dL   GFR calc non Af Amer >60 >60 mL/min   GFR calc Af Amer >60 >60 mL/min   Anion gap 11 5 - 15  Brain natriuretic peptide  Result Value Ref Range   B Natriuretic Peptide 27.9 0.0 - 100.0 pg/mL  I-Stat Troponin, ED (not at [facility])  Result Value Ref Range   Troponin i, poc 0.01 0.00 - 0.08 ng/mL   Comment 3           Dg Chest 2 View  Result Date: 08/03/2016 CLINICAL DATA:  Shortness of breath and wheezing.  Smoker. EXAM: CHEST  2 VIEW COMPARISON:  None. FINDINGS: Cardiomediastinal silhouette is normal. No pleural effusions or focal consolidations. Hyperinflation, flattened hemidiaphragm. Trachea projects midline and there is no pneumothorax. Soft tissue planes and included osseous structures are non-suspicious. IMPRESSION: Hyperinflation without focal consolidation. Electronically Signed   By: Elon Alas M.D.   On: 08/03/2016 21:58    DIAGNOSTIC STUDIES: Oxygen Saturation is 94% on RA, low by my  interpretation.  COORDINATION OF CARE:  11:20 PM Discussed treatment plan with pt at bedside and pt agreed to plan.  Labs (all labs ordered are listed, but only abnormal results are displayed) Labs Reviewed  CBC WITH DIFFERENTIAL/PLATELET  BASIC METABOLIC PANEL  BRAIN NATRIURETIC PEPTIDE  I-STAT [place], ED    EKG   EKG Interpretation  Date/Time:  Wednesday August 03 2016 23:30:34 EST Ventricular Rate:  119 PR Interval:    QRS Duration: 94 QT Interval:  294 QTC Calculation: 414 R Axis:   -76 Text Interpretation:  Sinus tachycardia Left anterior fascicular block Confirmed by [facility]  MD, Adryen Cookson ([id number]) on 08/03/2016 11:56:22 PM        Radiology Dg Chest 2 View  Result Date: 08/03/2016 CLINICAL DATA:  Shortness of breath and wheezing.  Smoker. EXAM: CHEST  2 VIEW COMPARISON:  None. FINDINGS: Cardiomediastinal silhouette is normal. No pleural effusions or focal consolidations. Hyperinflation, flattened hemidiaphragm. Trachea projects midline and there is no pneumothorax. Soft tissue planes and included osseous structures are non-suspicious. IMPRESSION: Hyperinflation without focal consolidation. Electronically Signed   By: Elon Alas M.D.   On: 08/03/2016 21:58    Procedures Procedures (including critical care time)  Medications Ordered in ED  Medications  albuterol (PROVENTIL, VENTOLIN) (5 MG/ML) 0.5% continuous inhalation solution (not administered)  nicotine (NICODERM CQ - dosed in mg/24 hours) patch 21 mg (not administered)  ipratropium (ATROVENT) nebulizer solution 0.5 mg (not administered)  levalbuterol (XOPENEX) nebulizer solution 1.25 mg (not administered)  dextromethorphan-guaiFENesin (MUCINEX DM) 30-600 MG per 12 hr tablet 1 tablet (not administered)  ALPRAZolam (XANAX) tablet 1 mg (not administered)  albuterol (PROVENTIL) (2.5 MG/3ML) 0.083% nebulizer solution 5 mg (5 mg Nebulization Given 08/03/16 2142)  sodium chloride 0.9 % bolus 1,000 mL (0  mLs Intravenous Stopped 08/04/16 0019)  ipratropium-albuterol (DUONEB) 0.5-2.5 (3) MG/3ML nebulizer solution (3 mLs  Given 08/03/16 2300)  albuterol (PROVENTIL,VENTOLIN) solution continuous neb (10 mg/hr Nebulization Given 08/03/16 2323)  methylPREDNISolone sodium succinate (SOLU-MEDROL) 125 mg/2 mL injection 125 mg (125 mg Intravenous Given 08/03/16 2334)  magnesium sulfate IVPB 2 g 50 mL (0 g Intravenous Stopped 08/04/16 0019)    MDM Reviewed: previous chart, nursing note and vitals Interpretation: labs, ECG and x-ray (no elevation of troponin secondary polcythemia Hb 19.5 elevation of WBC count no PNA on CXR by me.  ) Total time providing critical care: > 105 minutes (due to complexity and due to ongoing continuous nebs). This excludes time spent performing separately reportable procedures and services. Consults: admitting MD   CRITICAL CARE Performed by: Carlisle Beers Total critical care time: 120 minutes Critical care time was exclusive of separately billable procedures and treating other patients. Critical care was necessary to treat or prevent imminent or life-threatening deterioration. Critical care was time spent personally by me on the following activities: development of treatment plan with patient and/or surrogate as well as nursing, discussions with consultants, evaluation of patient's response to treatment, examination of patient, obtaining history from patient or surrogate, ordering and performing treatments and interventions, ordering and review of laboratory studies, ordering and review of radiographic studies, pulse oximetry and re-evaluation of patient's condition.  Final Clinical Impressions(s) / ED Diagnoses  COPD exacerbation due to ongoing hypoxia and continuous nebs will need inpatient admission.       Veatrice Kells, MD 08/04/16 [phone]

## 2016-08-03 NOTE — ED Triage Notes (Signed)
Pt comes in with shortness of breath.  Pt was recently treated for an ear infection and started prednisone and amoxicillin yesterday due to it not clearing up and wasn't sure if this caused it.  Patient exhibits dyspnea with wheezing.  Endorses smoking and congestion with productive cough.

## 2016-08-03 NOTE — ED Notes (Signed)
Pt placed back in lobby and immediately turned around stating he could not breathe again.  Placed back on pulse ox and was saturating at 85%.  Pt able to talk with some trouble at this time.

## 2016-08-03 NOTE — ED Notes (Signed)
Called respiratory for breathing treatment

## 2016-08-04 ENCOUNTER — Encounter (HOSPITAL_COMMUNITY): Payer: Self-pay | Admitting: Emergency Medicine

## 2016-08-04 DIAGNOSIS — J441 Chronic obstructive pulmonary disease with (acute) exacerbation: Secondary | ICD-10-CM | POA: Diagnosis not present

## 2016-08-04 DIAGNOSIS — Z720 Tobacco use: Secondary | ICD-10-CM | POA: Diagnosis not present

## 2016-08-04 DIAGNOSIS — M549 Dorsalgia, unspecified: Secondary | ICD-10-CM

## 2016-08-04 DIAGNOSIS — F17200 Nicotine dependence, unspecified, uncomplicated: Secondary | ICD-10-CM

## 2016-08-04 DIAGNOSIS — G8929 Other chronic pain: Secondary | ICD-10-CM | POA: Diagnosis not present

## 2016-08-04 LAB — BLOOD GAS, ARTERIAL
ACID-BASE DEFICIT: 3.1 mmol/L — AB (ref 0.0–2.0)
BICARBONATE: 19.7 mmol/L — AB (ref 20.0–28.0)
Drawn by: 11249
FIO2: 0.21
O2 Saturation: 89.6 %
PH ART: 7.416 (ref 7.350–7.450)
Patient temperature: 98.6
pCO2 arterial: 31.3 mmHg — ABNORMAL LOW (ref 32.0–48.0)
pO2, Arterial: 58.1 mmHg — ABNORMAL LOW (ref 83.0–108.0)

## 2016-08-04 LAB — CREATININE, SERUM
CREATININE: 1.14 mg/dL (ref 0.61–1.24)
GFR calc Af Amer: >60 mL/min (ref >60)

## 2016-08-04 LAB — INFLUENZA PANEL BY PCR (TYPE A & B)
INFLAPCR: NEGATIVE
Influenza B By PCR: NEGATIVE

## 2016-08-04 LAB — CBC
HCT: 45.4 % (ref 39.0–52.0)
Hemoglobin: 15.8 g/dL (ref 13.0–17.0)
MCH: 31.5 pg (ref 26.0–34.0)
MCHC: 34.8 g/dL (ref 30.0–36.0)
MCV: 90.4 fL (ref 78.0–100.0)
PLATELETS: 156 10*3/uL (ref 150–400)
RBC: 5.02 MIL/uL (ref 4.22–5.81)
RDW: 14.1 % (ref 11.5–15.5)
WBC: 11.4 10*3/uL — AB (ref 4.0–10.5)

## 2016-08-04 MED ORDER — LEVALBUTEROL HCL 1.25 MG/0.5ML IN NEBU
INHALATION_SOLUTION | RESPIRATORY_TRACT | Status: AC
  Filled 2016-08-04: qty 0.5

## 2016-08-04 MED ORDER — AZITHROMYCIN 250 MG PO TABS
250.0000 mg | ORAL_TABLET | Freq: Every day | ORAL | Status: DC
  Administered 2016-08-05: 250 mg via ORAL
  Filled 2016-08-04 (×2): qty 1

## 2016-08-04 MED ORDER — PREDNISONE 20 MG PO TABS
40.0000 mg | ORAL_TABLET | Freq: Every day | ORAL | Status: DC
  Administered 2016-08-04: 40 mg via ORAL
  Filled 2016-08-04: qty 2

## 2016-08-04 MED ORDER — FLUOXETINE HCL 10 MG PO CAPS
10.0000 mg | ORAL_CAPSULE | Freq: Every morning | ORAL | Status: DC
  Administered 2016-08-04 – 2016-08-05 (×2): 10 mg via ORAL
  Filled 2016-08-04 (×2): qty 1

## 2016-08-04 MED ORDER — LEVALBUTEROL HCL 1.25 MG/0.5ML IN NEBU
1.2500 mg | INHALATION_SOLUTION | Freq: Four times a day (QID) | RESPIRATORY_TRACT | Status: DC
  Administered 2016-08-04: 1.25 mg via RESPIRATORY_TRACT

## 2016-08-04 MED ORDER — ALPRAZOLAM 1 MG PO TABS
1.0000 mg | ORAL_TABLET | Freq: Three times a day (TID) | ORAL | Status: DC | PRN
  Administered 2016-08-04 – 2016-08-05 (×4): 1 mg via ORAL
  Filled 2016-08-04 (×3): qty 1
  Filled 2016-08-04: qty 2
  Filled 2016-08-04: qty 1

## 2016-08-04 MED ORDER — ALBUTEROL SULFATE (2.5 MG/3ML) 0.083% IN NEBU: 5.0000 mg | INHALATION_SOLUTION | Freq: Once | RESPIRATORY_TRACT | Status: DC

## 2016-08-04 MED ORDER — IPRATROPIUM BROMIDE 0.02 % IN SOLN
RESPIRATORY_TRACT | Status: AC
  Filled 2016-08-04: qty 2.5

## 2016-08-04 MED ORDER — ZOLPIDEM TARTRATE 10 MG PO TABS
10.0000 mg | ORAL_TABLET | Freq: Every day | ORAL | Status: DC
  Administered 2016-08-04: 10 mg via ORAL
  Filled 2016-08-04: qty 1

## 2016-08-04 MED ORDER — LEVALBUTEROL HCL 1.25 MG/0.5ML IN NEBU
1.2500 mg | INHALATION_SOLUTION | RESPIRATORY_TRACT | Status: DC
  Administered 2016-08-04 (×4): 1.25 mg via RESPIRATORY_TRACT
  Filled 2016-08-04 (×5): qty 0.5

## 2016-08-04 MED ORDER — IPRATROPIUM BROMIDE 0.02 % IN SOLN
0.5000 mg | RESPIRATORY_TRACT | Status: DC
  Administered 2016-08-04 (×5): 0.5 mg via RESPIRATORY_TRACT
  Filled 2016-08-04 (×4): qty 2.5

## 2016-08-04 MED ORDER — POLYETHYLENE GLYCOL 3350 17 G PO PACK: 17.0000 g | PACK | Freq: Every day | ORAL | Status: DC | PRN

## 2016-08-04 MED ORDER — LEVALBUTEROL HCL 1.25 MG/0.5ML IN NEBU
1.2500 mg | INHALATION_SOLUTION | Freq: Four times a day (QID) | RESPIRATORY_TRACT | Status: DC | PRN
  Administered 2016-08-05: 1.25 mg via RESPIRATORY_TRACT
  Filled 2016-08-04 (×2): qty 0.5

## 2016-08-04 MED ORDER — ENOXAPARIN SODIUM 40 MG/0.4ML ~~LOC~~ SOLN
40.0000 mg | SUBCUTANEOUS | Status: DC
  Administered 2016-08-04: 40 mg via SUBCUTANEOUS
  Filled 2016-08-04: qty 0.4

## 2016-08-04 MED ORDER — DM-GUAIFENESIN ER 30-600 MG PO TB12
1.0000 | ORAL_TABLET | Freq: Two times a day (BID) | ORAL | Status: DC
  Administered 2016-08-04 – 2016-08-05 (×4): 1 via ORAL
  Filled 2016-08-04 (×5): qty 1

## 2016-08-04 MED ORDER — NICOTINE 21 MG/24HR TD PT24
21.0000 mg | MEDICATED_PATCH | Freq: Every day | TRANSDERMAL | Status: DC
  Filled 2016-08-04: qty 1

## 2016-08-04 MED ORDER — IBUPROFEN 200 MG PO TABS
400.0000 mg | ORAL_TABLET | Freq: Three times a day (TID) | ORAL | Status: DC
  Administered 2016-08-04 – 2016-08-05 (×4): 400 mg via ORAL
  Filled 2016-08-04 (×4): qty 2

## 2016-08-04 MED ORDER — DEXTROSE 5 % IV SOLN
1.0000 g | INTRAVENOUS | Status: DC
  Administered 2016-08-04 – 2016-08-05 (×2): 1 g via INTRAVENOUS
  Filled 2016-08-04 (×2): qty 10

## 2016-08-04 MED ORDER — LEVALBUTEROL HCL 1.25 MG/0.5ML IN NEBU
INHALATION_SOLUTION | RESPIRATORY_TRACT | Status: AC
  Administered 2016-08-04: 1.25 mg
  Filled 2016-08-04: qty 0.5

## 2016-08-04 MED ORDER — IPRATROPIUM BROMIDE 0.02 % IN SOLN
0.5000 mg | Freq: Four times a day (QID) | RESPIRATORY_TRACT | Status: DC | PRN
  Administered 2016-08-05: 0.5 mg via RESPIRATORY_TRACT
  Filled 2016-08-04: qty 2.5

## 2016-08-04 MED ORDER — AZITHROMYCIN 250 MG PO TABS
500.0000 mg | ORAL_TABLET | Freq: Once | ORAL | Status: AC
  Administered 2016-08-04: 500 mg via ORAL
  Filled 2016-08-04: qty 2

## 2016-08-04 NOTE — ED Notes (Signed)
No respiratory or acute distress noted alert and oriented x 3 no reaction to medication noted call light in reach. 

## 2016-08-04 NOTE — H&P (Signed)
History and Physical    Mitchell Dean [id number] DOB: 25-Nov-1955 DOA: 08/03/2016  PCP: Reginia Naas, MD  Patient coming from: Home  Chief Complaint: Shortness of Breath  HPI: Mitchell Dean is a 61 y.o. male with medical history significant of back pain and tobacco abuse that presented to the ED for dyspnea.  He voices that he has not noticed dyspnea on exertion due to back pain that has been going on for the past year and half.  He says his illness started 2 weeks ago when he went to see his PCP for fluid behind his ear and for shortness of breath.  He was given Flonase and Allegra and saw no improvement with this treatment.  He states he went back to the PCP two days prior to admission for continued symptoms and was given Prednisone and Amoxicillin.  He reports that less than 48 hours after starting these medications he felt more breathless and was doubled over trying to catch his breath.  Has had a cough that is not productive.  Denies ever being hospitalized for respiratory problems before. States at time of evaluation he feels markedly better. No chest pain, no chest pressure but does have chest soreness from coughing.  States his has not had any lightheadedness or dizziness.  Does have back pain that is chronic in nature.  Currently not requiring oxygen.  ED Course: Patient was seen and examined by EDP.  He was given continuous breathing treatments but failed to improve.  As such, [facility] was asked to admit for COPD exacerbation with ongoing hypoxia.    Review of Systems: As per HPI otherwise 10 point review of systems negative.    Past Medical History:  Diagnosis Date  . Arthritis    hands  . Constipation   . Mild acid reflux   . Prolapsed internal hemorrhoids     Past Surgical History:  Procedure Laterality Date  . APPENDECTOMY  2001  . HEMORRHOID SURGERY N/A 04/02/2014   Procedure: EXCISION OF INTERNAL HEMORRHOIDS;  Surgeon: Leighton Ruff, MD;  Location: [place] [facility];  Service: General;  Laterality: N/A;  . I & D  RIGHT LEG INFECTION, EXTENSIVE  AGE 34     reports that he has been smoking Cigarettes.  He has a 40.00 pack-year smoking history. He has never used smokeless tobacco. He reports that he drinks alcohol. He reports that he does not use drugs.  Allergies  Allergen Reactions  . Contrast Media [Iodinated Diagnostic Agents] Swelling    Family History  Problem Relation Age of Onset  . COPD Father   . Hypertension Neg Hx   . Cancer Neg Hx   . Stroke Neg Hx      Prior to Admission medications   Medication Sig Start Date End Date Taking? Authorizing Provider  ALPRAZolam [name]) 1 MG tablet Take 1 mg by mouth 3 (three) times daily as needed for anxiety. 07/19/16  Yes Historical Provider, MD  amoxicillin (AMOXIL) 875 MG tablet Take 1 tablet by mouth every 12 (twelve) hours. 08/02/16  Yes Historical Provider, MD  FLUoxetine (PROZAC) 10 MG capsule Take 10 mg by mouth every morning. 07/17/16  Yes Historical Provider, MD  Multiple Vitamin (MULTIVITAMIN) tablet Take 1 tablet by mouth daily.   Yes Historical Provider, MD  polyethylene glycol (MIRALAX / GLYCOLAX) packet Take 17 g by mouth daily as needed for moderate constipation.   Yes Historical Provider, MD  predniSONE (DELTASONE) 20 MG tablet Take 40 mg by mouth  daily with breakfast.   Yes Historical Provider, MD  zolpidem (AMBIEN) 10 MG tablet Take 10 mg by mouth at bedtime.   Yes Historical Provider, MD  oxyCODONE-acetaminophen (ROXICET) 5-325 MG per tablet Take 1-2 tablets by mouth every 4 (four) hours as needed for severe pain. Patient not taking: Reported on [id number] [id number]   Leighton Ruff, MD    Physical Exam: Vitals:   08/04/16 0606 08/04/16 0828 08/04/16 1326 08/04/16 1531  BP: 125/70  107/70   Pulse: 97  85 92  Resp: (!) 24  20 18   Temp: 98.1 F (36.7 C)  98.1 F (36.7 C)   TempSrc: Oral  Oral   SpO2: 97% 94% 91% 95%  Weight: 80.1 kg (176 lb 9.4 oz)     Height: 5\' 7"   (1.702 m)         Constitutional: appears slightly anxious Vitals:   08/04/16 0606 08/04/16 0828 08/04/16 1326 08/04/16 1531  BP: 125/70  107/70   Pulse: 97  85 92  Resp: (!) 24  20 18   Temp: 98.1 F (36.7 C)  98.1 F (36.7 C)   TempSrc: Oral  Oral   SpO2: 97% 94% 91% 95%  Weight: 80.1 kg (176 lb 9.4 oz)     Height: 5\' 7"  (1.702 m)      Eyes: PERRL, lids and conjunctivae normal ENMT: Mucous membranes are moist. Posterior pharynx clear of any exudate or lesions.Normal dentition.  Neck: normal, supple, no masses, no thyromegaly Respiratory: increased respiratory effort, poor air movement in lung fields bilaterally, no wheezing likely due to poor air movement  Cardiovascular: Regular rate and rhythm, no murmurs / rubs / gallops. No extremity edema. 2+ pedal pulses. No carotid bruits.  Abdomen: no tenderness, no masses palpated. No hepatosplenomegaly. Bowel sounds positive.  Musculoskeletal: no clubbing / cyanosis. No joint deformity upper and lower extremities. Good ROM, no contractures. Normal muscle tone.  Skin: no rashes, lesions, ulcers. No induration Neurologic: CN 2-12 grossly intact. Sensation intact, DTR normal. Strength 5/5 in all 4.  Psychiatric: Normal judgment and insight. Alert and oriented x 3. Normal mood.    Labs on Admission: I have personally reviewed following labs and imaging studies  CBC:  Recent Labs Lab 08/03/16 2316 08/04/16 1122  WBC 16.0* 11.4*  NEUTROABS 13.6*  --   HGB 19.5* 15.8  HCT 54.4* 45.4  MCV 89.9 90.4  PLT 177 [id number]   Basic Metabolic Panel:  Recent Labs Lab 08/03/16 2316 08/04/16 1122  NA 139  --   K 3.7  --   CL 101  --   CO2 27  --   GLUCOSE 110*  --   BUN 14  --   CREATININE 1.19 1.14  CALCIUM 9.5  --    GFR: Estimated Creatinine Clearance: 69 mL/min (by C-G formula based on SCr of 1.14 mg/dL). Liver Function Tests: No results for input(s): AST, ALT, ALKPHOS, BILITOT, PROT, ALBUMIN in the last 168 hours. No results  for input(s): LIPASE, AMYLASE in the last 168 hours. No results for input(s): AMMONIA in the last 168 hours. Coagulation Profile: No results for input(s): INR, PROTIME in the last 168 hours. Cardiac Enzymes: No results for input(s): CKTOTAL, CKMB, CKMBINDEX, TROPONINI in the last 168 hours. BNP (last 3 results) No results for input(s): PROBNP in the last 8760 hours. HbA1C: No results for input(s): HGBA1C in the last 72 hours. CBG: No results for input(s): GLUCAP in the last 168 hours. Lipid Profile: No results for input(s): CHOL, HDL,  LDLCALC, TRIG, CHOLHDL, LDLDIRECT in the last 72 hours. Thyroid Function Tests: No results for input(s): TSH, T4TOTAL, FREET4, T3FREE, THYROIDAB in the last 72 hours. Anemia Panel: No results for input(s): VITAMINB12, FOLATE, FERRITIN, TIBC, IRON, RETICCTPCT in the last 72 hours. Urine analysis: No results found for: COLORURINE, APPEARANCEUR, LABSPEC, PHURINE, GLUCOSEU, HGBUR, BILIRUBINUR, KETONESUR, PROTEINUR, UROBILINOGEN, NITRITE, LEUKOCYTESUR Sepsis Labs: !!!!!!!!!!!!!!!!!!!!!!!!!!!!!!!!!!!!!!!!!!!! @LABRCNTIP (procalcitonin:4,lacticidven:4) )No results found for this or any previous visit (from the past 240 hour(s)).   Radiological Exams on Admission: Dg Chest 2 View  Result Date: 08/03/2016 CLINICAL DATA:  Shortness of breath and wheezing.  Smoker. EXAM: CHEST  2 VIEW COMPARISON:  None. FINDINGS: Cardiomediastinal silhouette is normal. No pleural effusions or focal consolidations. Hyperinflation, flattened hemidiaphragm. Trachea projects midline and there is no pneumothorax. Soft tissue planes and included osseous structures are non-suspicious. IMPRESSION: Hyperinflation without focal consolidation. Electronically Signed   By: Elon Alas M.D.   On: 08/03/2016 21:58    EKG: Independently reviewed. Sinus tachycardia  Assessment/Plan Principal Problem:   COPD exacerbation (HCC) Active Problems:   Back pain   Tobacco abuse  COPD  exacerbation - Ceftriaxone and Azithromycin - Prednisone 40mg  daily - mucinex, atrovent and xopenex - titrate oxygen as tolerated - discussed smoking cessation extensively - discussed with respiratory therapy and patient in low 90s on room air but not ambulating  Back Pain - tylenol as needed  Depression and Anxiety - continue xanax, prozac and ambien at home dosages   DVT prophylaxis: lovenox Code Status:  Full Code Family Communication: No family bedside  Disposition Plan: Likely home in the next 24 hours  Consults called: None  Admission status: Observation to med surg  Loretha Stapler MD [facility] Pager [phone]  If 7PM-7AM, please contact night-coverage [email or web address] Password [email or web address]  08/04/2016, 3:46 PM

## 2016-08-04 NOTE — Progress Notes (Signed)
This is a no charge note  Pending admission per Dr. Nicholes Stairs  61 year old male with past medical history of tobacco abuse, GERD, depression, who presents with cough, shortness of breath and wheezing. No flu symptoms. No fever or chills. Chest x-ray has no infiltration. Patient does not have a history of COPD, but he is a smoker, clinically consistent with COPD exacerbation. Pt is accepted to tele bed as inpt.  Ivor Costa, MD  [facility] Pager [phone]  If 7PM-7AM, please contact night-coverage [email or web address] Password [email or web address] 08/04/2016, 3:00 AM

## 2016-08-04 NOTE — ED Notes (Addendum)
No respiratory or acute distress noted alert and oriented x 3 call light in reach no reaction to medication noted. Hospital bed in room for pt.

## 2016-08-04 NOTE — Progress Notes (Signed)
PT Cancellation Note  Patient Details Name: KHANI EBEL MRN: [id number] DOB: 1956-05-29   Cancelled Treatment:    Reason Eval/Treat Not Completed: PT screened, no needs identified, will sign off. Spoke with RN who reports pt was ambulating in room without difficulty. Spoke with pt as well who denied need for therapy services.    Weston Anna, MPT Pager: [phone]

## 2016-08-05 DIAGNOSIS — Z720 Tobacco use: Secondary | ICD-10-CM

## 2016-08-05 DIAGNOSIS — G8929 Other chronic pain: Secondary | ICD-10-CM

## 2016-08-05 DIAGNOSIS — M549 Dorsalgia, unspecified: Secondary | ICD-10-CM

## 2016-08-05 DIAGNOSIS — J441 Chronic obstructive pulmonary disease with (acute) exacerbation: Principal | ICD-10-CM

## 2016-08-05 MED ORDER — VARENICLINE TARTRATE 0.5 MG X 11 & 1 MG X 42 PO MISC: ORAL | 0 refills | Status: DC

## 2016-08-05 MED ORDER — NICOTINE 21 MG/24HR TD PT24: 21.0000 mg | MEDICATED_PATCH | Freq: Every day | TRANSDERMAL | 0 refills | Status: DC

## 2016-08-05 MED ORDER — ALBUTEROL SULFATE HFA 108 (90 BASE) MCG/ACT IN AERS: 2.0000 | INHALATION_SPRAY | RESPIRATORY_TRACT | 2 refills | Status: AC | PRN

## 2016-08-05 MED ORDER — TIOTROPIUM BROMIDE MONOHYDRATE 18 MCG IN CAPS: 18.0000 ug | ORAL_CAPSULE | Freq: Every day | RESPIRATORY_TRACT | 0 refills | Status: DC

## 2016-08-05 MED ORDER — FLUTICASONE FUROATE-VILANTEROL 200-25 MCG/INH IN AEPB: 1.0000 | INHALATION_SPRAY | Freq: Every day | RESPIRATORY_TRACT | 0 refills | Status: DC

## 2016-08-05 MED ORDER — PREDNISONE 20 MG PO TABS: 40.0000 mg | ORAL_TABLET | Freq: Every day | ORAL | 0 refills | Status: DC

## 2016-08-05 MED ORDER — AZITHROMYCIN 250 MG PO TABS: ORAL_TABLET | ORAL | 0 refills | Status: DC

## 2016-08-05 MED ORDER — ENSURE ENLIVE PO LIQD: 237.0000 mL | Freq: Two times a day (BID) | ORAL | Status: DC

## 2016-08-05 NOTE — Discharge Summary (Signed)
Physician Discharge Summary  JOSEN AMENTA  [id number]  DOB: [birth year]  DOA: 08/03/2016 PCP: Reginia Naas, MD  Admit date: 08/03/2016 Discharge date: 08/05/2016  Admitted From: Home  Disposition:  Home  Recommendations for Outpatient Follow-up:  1. Follow up with PCP in 1 week 2. Please obtain BMP/CBC in one week 3. Make an appointment with Pulmonologist for PFT's   Discharge Condition: Improved   CODE STATUS: FULL Diet recommendation: Heart Healthy    Brief/Interim Summary: 61 y/o M with PMHx of back pain and tobacco abuse presented to the ED for with SOB, that worsened for the past 2 weeks. Was seen by his PCP and was treated with amox for sinusitis with no significant improvement. Subsequently went back again and was given prednisone. Patient was admitted for presumed COPD exacerbation with hypoxia. Patient was treated with nebulizer and steroids and antibiotics. Patient subsequently improved and will be discharged home with bronchodilators and prednisone.    Subjective: Patient seen and examined at bedside. Report great improvement on his breathing which is back to baseline. Denies SOB, chest pain and palpitations.   Discharge Diagnoses/Hospital Course:  COPD exacerbation - clinical diagnosis - patient presented with wheezing, and SOB, CXR show air retention and patient with 40+ years of smoking cigarettes.  Initially treated with Ceftriaxone and Azithromycin Will continue Azithro for 4 more days - complete 5 days course Prednisone 40 mg for 5 days Started on Breo Ellipta, Ipatropium and Albuterol nebs - prescription given  Discussed smoking cessation extensively Follow up with PCP  Patient need PFT as outpatient   Tobacco Abuse  Tobacco cessation discussed Started on nicotine Patch and Chantix   Back Pain - Chronic  Tylenol as needed Resume home Roxicet   Depression and Anxiety - stable no SI/HI  continue xanax, prozac and ambien at home  dosages  Discharge Instructions  You were cared for by a hospitalist during your hospital stay. If you have any questions about your discharge medications or the care you received while you were in the hospital after you are discharged, you can call the unit and asked to speak with the hospitalist on call if the hospitalist that took care of you is not available. Once you are discharged, your primary care physician will handle any further medical issues. Please note that NO REFILLS for any discharge medications will be authorized once you are discharged, as it is imperative that you return to your primary care physician (or establish a relationship with a primary care physician if you do not have one) for your aftercare needs so that they can reassess your need for medications and monitor your lab values.  Discharge Instructions    Call MD for:  difficulty breathing, headache or visual disturbances    Complete by:  As directed    Call MD for:  extreme fatigue    Complete by:  As directed    Call MD for:  hives    Complete by:  As directed    Call MD for:  persistant dizziness or light-headedness    Complete by:  As directed    Call MD for:  persistant nausea and vomiting    Complete by:  As directed    Call MD for:  redness, tenderness, or signs of infection (pain, swelling, redness, odor or green/yellow discharge around incision site)    Complete by:  As directed    Call MD for:  severe uncontrolled pain    Complete by:  As directed  Call MD for:  temperature >100.4    Complete by:  As directed    Diet - low sodium heart healthy    Complete by:  As directed    Increase activity slowly    Complete by:  As directed      Allergies as of 08/05/2016      Reactions   Contrast Media [iodinated Diagnostic Agents] Swelling      Medication List    STOP taking these medications   amoxicillin 875 MG tablet Commonly known as:  AMOXIL   oxyCODONE-acetaminophen 5-325 MG tablet Commonly  known as:  ROXICET     TAKE these medications   albuterol 108 (90 Base) MCG/ACT inhaler Commonly known as:  PROVENTIL HFA;VENTOLIN HFA Inhale 2 puffs into the lungs every 4 (four) hours as needed for wheezing or shortness of breath.   ALPRAZolam 1 MG tablet Commonly known as:  XANAX Take 1 mg by mouth 3 (three) times daily as needed for anxiety.   azithromycin 250 MG tablet Commonly known as:  ZITHROMAX Take 1 pill a day for four days Start taking on:  08/06/2016   FLUoxetine 10 MG capsule Commonly known as:  PROZAC Take 10 mg by mouth every morning.   fluticasone furoate-vilanterol 200-25 MCG/INH Aepb Commonly known as:  BREO ELLIPTA Inhale 1 puff into the lungs daily.   multivitamin tablet Take 1 tablet by mouth daily.   nicotine 21 mg/24hr patch Commonly known as:  NICODERM CQ - dosed in mg/24 hours Place 1 patch (21 mg total) onto the skin daily. Start taking on:  08/06/2016   polyethylene glycol packet Commonly known as:  MIRALAX / GLYCOLAX Take 17 g by mouth daily as needed for moderate constipation.   predniSONE 20 MG tablet Commonly known as:  DELTASONE Take 2 tablets (40 mg total) by mouth daily with breakfast. Start taking on:  08/06/2016   tiotropium 18 MCG inhalation capsule Commonly known as:  SPIRIVA HANDIHALER Place 1 capsule (18 mcg total) into inhaler and inhale daily.   varenicline 0.5 MG X 11 & 1 MG X 42 tablet Commonly known as:  CHANTIX STARTING MONTH PAK Take one 0.5 mg tablet by mouth once daily for 3 days, then increase to one 0.5 mg tablet twice daily for 4 days, then increase to one 1 mg tablet twice daily.   zolpidem 10 MG tablet Commonly known as:  AMBIEN Take 10 mg by mouth at bedtime.      Follow-up Information    Reginia Naas, MD. Schedule an appointment as soon as possible for a visit in 1 week(s).   Specialty:  Family Medicine Contact information: [place] [address] [phone]         Simonne Maffucci, MD. Call in 2 week(s).   Specialty:  Pulmonary Disease Why:  Call to establish care [facility] follow up  Contact information: [address] [place] [address] [phone]          Allergies  Allergen Reactions  . Contrast Media [Iodinated Diagnostic Agents] Swelling    Consultations:  None    Procedures/Studies: Dg Chest 2 View  Result Date: 08/03/2016 CLINICAL DATA:  Shortness of breath and wheezing.  Smoker. EXAM: CHEST  2 VIEW COMPARISON:  None. FINDINGS: Cardiomediastinal silhouette is normal. No pleural effusions or focal consolidations. Hyperinflation, flattened hemidiaphragm. Trachea projects midline and there is no pneumothorax. Soft tissue planes and included osseous structures are non-suspicious. IMPRESSION: Hyperinflation without focal consolidation. Electronically Signed  By: Elon Alas M.D.   On: 08/03/2016 21:58     Discharge Exam: Vitals:   08/04/16 2228 08/05/16 0456  BP: 115/76 122/69  Pulse: 94 66  Resp: 18 18  Temp: 97.8 F (36.6 C) 97.6 F (36.4 C)   Vitals:   08/04/16 1531 08/04/16 2228 08/05/16 0456 08/05/16 1248  BP:  115/76 122/69   Pulse: 92 94 66   Resp: 18 18 18    Temp:  97.8 F (36.6 C) 97.6 F (36.4 C)   TempSrc:  Oral Oral   SpO2: 95% 96% 93% 92%  Weight:      Height:        General: Pt is alert, awake, not in acute distress Cardiovascular: RRR, S1/S2 +, no rubs, no gallops Respiratory: CTA bilaterally, no wheezing, no rhonchi Abdominal: Soft, NT, ND, bowel sounds + Extremities: no edema, no cyanosis   The results of significant diagnostics from this hospitalization (including imaging, microbiology, ancillary and laboratory) are listed below for reference.     Microbiology: No results found for this or any previous visit (from the past 240 hour(s)).   Labs: BNP (last 3 results)  Recent Labs  08/03/16 2316  BNP [id number]   Basic Metabolic Panel:  Recent Labs Lab 08/03/16 2316  08/04/16 1122  NA 139  --   K 3.7  --   CL 101  --   CO2 27  --   GLUCOSE 110*  --   BUN 14  --   CREATININE 1.19 1.14  CALCIUM 9.5  --    Liver Function Tests: No results for input(s): AST, ALT, ALKPHOS, BILITOT, PROT, ALBUMIN in the last 168 hours. No results for input(s): LIPASE, AMYLASE in the last 168 hours. No results for input(s): AMMONIA in the last 168 hours. CBC:  Recent Labs Lab 08/03/16 2316 08/04/16 1122  WBC 16.0* 11.4*  NEUTROABS 13.6*  --   HGB 19.5* 15.8  HCT 54.4* 45.4  MCV 89.9 90.4  PLT 177 156   Cardiac Enzymes: No results for input(s): CKTOTAL, CKMB, CKMBINDEX, TROPONINI in the last 168 hours. BNP: Invalid input(s): POCBNP CBG: No results for input(s): GLUCAP in the last 168 hours. D-Dimer No results for input(s): DDIMER in the last 72 hours. Hgb A1c No results for input(s): HGBA1C in the last 72 hours. Lipid Profile No results for input(s): CHOL, HDL, LDLCALC, TRIG, CHOLHDL, LDLDIRECT in the last 72 hours. Thyroid function studies No results for input(s): TSH, T4TOTAL, T3FREE, THYROIDAB in the last 72 hours.  Invalid input(s): FREET3 Anemia work up No results for input(s): VITAMINB12, FOLATE, FERRITIN, TIBC, IRON, RETICCTPCT in the last 72 hours. Urinalysis No results found for: COLORURINE, APPEARANCEUR, LABSPEC, [place], GLUCOSEU, HGBUR, BILIRUBINUR, KETONESUR, PROTEINUR, UROBILINOGEN, NITRITE, LEUKOCYTESUR Sepsis Labs Invalid input(s): PROCALCITONIN,  WBC,  LACTICIDVEN Microbiology No results found for this or any previous visit (from the past 240 hour(s)).   Time coordinating discharge: Over 30 minutes  SIGNED:  Chipper Oman, MD  [facility] 08/05/2016, 8:47 PM Pager   If 7PM-7AM, please contact night-coverage [email or web address] Password [email or web address]

## 2016-08-05 NOTE — Progress Notes (Signed)
08/05/16  1330  Reviewed discharge instructions with patient. Patient verbalized understanding of discharge instructions. Copy of discharge instructions and prescriptions given to patient.

## 2016-08-05 NOTE — Progress Notes (Signed)
Initial Nutrition Assessment  DOCUMENTATION CODES:   Not applicable  INTERVENTION:  Ensure Enlive po BID, each supplement provides 350 kcal and 20 grams of protein  NUTRITION DIAGNOSIS:   Inadequate oral intake related to acute illness, chronic illness as evidenced by per patient/family report.  GOAL:   Patient will meet greater than or equal to 90% of their needs  MONITOR:   PO intake, Supplement acceptance  REASON FOR ASSESSMENT:   Consult Assessment of nutrition requirement/status  ASSESSMENT:   61 y.o. male with medical history significant of back pain and tobacco abuse that presented to the ED for dyspnea. Admitted for COPD exacerbation    Met with pt in room today. Pt reports poor appetite for 2 days pta. Pt is currently eating 50% meals and drank an Ensure at lunch today. Pt reports stable weights. RD discussed with pt the importance of adequate protein intake. Pt is willing to drink Ensure at home. Pt scheduled to discharge today.   Medications reviewed and include: zithromax, ceftriaxone, lovenox, prozac, nicotine, prednisone  Labs reviewed: wbc- 11.4(H)  Nutrition-Focused physical exam completed. Findings are no fat depletion, no muscle depletion, and no edema.   Diet Order:  Diet regular Room service appropriate? Yes; Fluid consistency: Thin Diet - low sodium heart healthy  Skin:  Reviewed, no issues  Last BM:  1/25  Height:   Ht Readings from Last 1 Encounters:  08/04/16 '5\' 7"'  (1.702 m)    Weight:   Wt Readings from Last 1 Encounters:  08/04/16 176 lb 9.4 oz (80.1 kg)    Ideal Body Weight:  67.2 kg  BMI:  Body mass index is 27.66 kg/m.  Estimated Nutritional Needs:   Kcal:  2000-2400kcal/day   Protein:  104-120g/day   Fluid:  >2L/day   EDUCATION NEEDS:   No education needs identified at this time  Koleen Distance, RD, LDN Pager #[phone]

## 2016-08-05 NOTE — Progress Notes (Signed)
Pt's co pay for Nicotine is $43.01 and Chantix is $211.49 both for 30 days.

## 2016-08-11 DIAGNOSIS — J441 Chronic obstructive pulmonary disease with (acute) exacerbation: Secondary | ICD-10-CM | POA: Diagnosis not present

## 2016-08-11 DIAGNOSIS — D72829 Elevated white blood cell count, unspecified: Secondary | ICD-10-CM | POA: Diagnosis not present

## 2016-08-11 DIAGNOSIS — R7989 Other specified abnormal findings of blood chemistry: Secondary | ICD-10-CM | POA: Diagnosis not present

## 2016-08-15 ENCOUNTER — Encounter: Payer: Self-pay | Admitting: Pulmonary Disease

## 2016-08-15 ENCOUNTER — Ambulatory Visit (INDEPENDENT_AMBULATORY_CARE_PROVIDER_SITE_OTHER): Payer: BLUE CROSS/BLUE SHIELD | Admitting: Pulmonary Disease

## 2016-08-15 VITALS — BP 118/76 | HR 97 | Ht 67.0 in | Wt 176.2 lb

## 2016-08-15 DIAGNOSIS — R0600 Dyspnea, unspecified: Secondary | ICD-10-CM | POA: Insufficient documentation

## 2016-08-15 DIAGNOSIS — R059 Cough, unspecified: Secondary | ICD-10-CM

## 2016-08-15 DIAGNOSIS — F17200 Nicotine dependence, unspecified, uncomplicated: Secondary | ICD-10-CM

## 2016-08-15 DIAGNOSIS — F329 Major depressive disorder, single episode, unspecified: Secondary | ICD-10-CM | POA: Insufficient documentation

## 2016-08-15 DIAGNOSIS — R05 Cough: Secondary | ICD-10-CM

## 2016-08-15 DIAGNOSIS — F32A Depression, unspecified: Secondary | ICD-10-CM | POA: Insufficient documentation

## 2016-08-15 MED ORDER — ALBUTEROL SULFATE HFA 108 (90 BASE) MCG/ACT IN AERS: 2.0000 | INHALATION_SPRAY | RESPIRATORY_TRACT | 5 refills | Status: DC | PRN

## 2016-08-15 NOTE — Progress Notes (Signed)
Subjective:    Patient ID: Mitchell Dean, male    DOB: 1955/07/28, 61 y.o.   MRN: [id number]  HPI  Patient was seen in the E.D. on 08/03/16 with progressively worsening dyspnea. He reports there was somewhat of an abrupt onset to his dyspnea. He reports during that week he had diffuse myalgias. He had a cough that persists even today. He reports his cough now produces a white or even a yellow mucus at times. He did complete a course of Prednisone & Antibiotics. He was prescribed Chantix to help with smoking cessation. He has noticed wheezing recently. No recent fever, chills, or sweats. He does wake up at night coughing at times. Remote bronchitis and pneumonia but this again was rare. No breathing problems as a child or young adult. He reports in the last 2 weeks he has had chest tightness but no frank chest pain. No reflux, dyspepsia, or morning brash water taste. He reports rare sinus congestion with seasonal changes. He has had some mild sinus drainage recently but no sinus pain. Patient was started on Frontier. He was also prescribed a rescue inhaler. He has been using his rescue inhaler but feels like he is not getting medication. He is using it sporadically but on a daily basis. He is using a spacer.   Review of Systems No rashes or bruising. No persistent headaches. Does report some cold hands and feet but no focal weakness or numbness. A pertinent 14 point review of systems is negative except as per the history of presenting illness.  Allergies  Allergen Reactions  . Contrast Media [Iodinated Diagnostic Agents] Swelling    Current Outpatient Prescriptions on File Prior to Visit  Medication Sig Dispense Refill  . albuterol (PROVENTIL HFA;VENTOLIN HFA) 108 (90 Base) MCG/ACT inhaler Inhale 2 puffs into the lungs every 4 (four) hours as needed for wheezing or shortness of breath. 1 Inhaler 2  . ALPRAZolam (XANAX) 1 MG tablet Take 1 mg by mouth 3 (three) times daily as needed for  anxiety.  5  . FLUoxetine (PROZAC) 10 MG capsule Take 10 mg by mouth every morning.  0  . fluticasone furoate-vilanterol (BREO ELLIPTA) 200-25 MCG/INH AEPB Inhale 1 puff into the lungs daily. 60 each 0  . Multiple Vitamin (MULTIVITAMIN) tablet Take 1 tablet by mouth daily.    . nicotine (NICODERM CQ - DOSED IN MG/24 HOURS) 21 mg/24hr patch Place 1 patch (21 mg total) onto the skin daily. 28 patch 0  . polyethylene glycol (MIRALAX / GLYCOLAX) packet Take 17 g by mouth daily as needed for moderate constipation.    Marland Kitchen tiotropium (SPIRIVA HANDIHALER) 18 MCG inhalation capsule Place 1 capsule (18 mcg total) into inhaler and inhale daily. 30 capsule 0  . varenicline (CHANTIX STARTING MONTH PAK) 0.5 MG X 11 & 1 MG X 42 tablet Take one 0.5 mg tablet by mouth once daily for 3 days, then increase to one 0.5 mg tablet twice daily for 4 days, then increase to one 1 mg tablet twice daily. 53 tablet 0  . zolpidem (AMBIEN) 10 MG tablet Take 10 mg by mouth at bedtime.    Marland Kitchen azithromycin (ZITHROMAX) 250 MG tablet Take 1 pill a day for four days (Patient not taking: Reported on 08/15/2016) 6 each 0  . predniSONE (DELTASONE) 20 MG tablet Take 2 tablets (40 mg total) by mouth daily with breakfast. (Patient not taking: Reported on 08/15/2016) 6 tablet 0   No current facility-administered medications on file prior  to visit.     Past Medical History:  Diagnosis Date  . Arthritis    hands  . Constipation   . Depression   . Mild acid reflux   . Prolapsed internal hemorrhoids     Past Surgical History:  Procedure Laterality Date  . APPENDECTOMY  2001  . HEMORRHOID SURGERY N/A 04/02/2014   Procedure: EXCISION OF INTERNAL HEMORRHOIDS;  Surgeon: Leighton Ruff, MD;  Location: [facility];  Service: General;  Laterality: N/A;  . I & D  RIGHT LEG INFECTION, EXTENSIVE  AGE 73  . LEG SURGERY Right     Family History  Problem Relation Age of Onset  . Emphysema Father   . Hypertension Neg Hx   . Cancer Neg  Hx   . Stroke Neg Hx     Social History   Social History  . Marital status: Single    Spouse name: N/A  . Number of children: N/A  . Years of education: N/A   Social History Main Topics  . Smoking status: Current Every Day Smoker    Packs/day: 1.00    Years: 45.00    Types: Cigarettes    Start date: 02/06/1971  . Smokeless tobacco: Never Used     Comment: Has smoked 2-4 cigarettes per day recently. Peak rate of 2ppd.  . Alcohol use Yes     Comment: OCCASIONAL  . Drug use: No  . Sexual activity: Not Asked   Other Topics Concern  . None   Social History Narrative   [facility] (08/15/16):   Originally from [facility]. Previously worked in [employer]. Patient has lived in [place]. No pets currently. No mold or bird exposure.       Objective:   Physical Exam BP 118/76 (BP Location: Right Arm, Patient Position: Sitting, Cuff Size: Normal)   Pulse 97   Ht 5\' 7"  (1.702 m)   Wt 176 lb 3.2 oz (79.9 kg)   SpO2 97%   BMI 27.60 kg/m  General:  Awake. Alert. No acute distress. Mild central obesity. Integument:  Warm & dry. No rash on exposed skin.  Extremities:  No cyanosis or clubbing.  Lymphatics:  No appreciated cervical or supraclavicular lymphadenoapthy. HEENT:  Moist mucus membranes. No oral ulcers. No scleral injection or icterus. Cardiovascular:  Regular rate and rhythm. No edema. No appreciable JVD.  Pulmonary:  Good aeration & clear to auscultation bilaterally. Symmetric chest wall expansion. No accessory muscle use on room air. Abdomen: Soft. Normal bowel sounds. Protuberant. Grossly nontender. Musculoskeletal:  Normal bulk and tone. Hand grip strength 5/5 bilaterally. No joint deformity or effusion appreciated. Neurological:  CN 2-12 grossly in tact. No meningismus. Moving all 4 extremities equally. Symmetric brachioradialis deep tendon reflexes. Psychiatric:  Mood and affect congruent. Speech normal rhythm, rate & tone.   IMAGING CXR PA/LAT 08/03/16  (personally reviewed by me): Hyperinflation with flattening of the diaphragms. No focal parenchymal opacity or mass appreciated. Heart normal in size & mediastinum normal in contour. No pleural effusion.  MICROBIOLOGY Influenza PCR 08/04/16:  Negative   LABS 08/04/16 ABG on RA:  7.416 / 31.3 / 58.1  08/04/16 BNP:  27.9 CBC: 11.4/15.8/45.4/156    Assessment & Plan:  61 y.o. male with tobacco use disorder. Patient's symptoms seem to have had a viral prodrome. He is recovering well. I do feel that pulmonary function testing is necessary to determine whether or not he truly has underlying COPD and requires continuation of his current inhaler regimen.  We did discuss his tobacco use at length as well as the potential negative side effects of continued use. I reviewed his x-ray which showed no evidence of pneumonia but did suggest the possibility of underlying emphysema/COPD. I instructed the patient contact my office if he had any new breathing problems or questions before his next appointment.  1. Cough & Dyspnea:  Likely secondary to COPD give on long-standing tobacco use disorder. Checking full pulmonary function testing and 6 minute walk test on room air before next appointment. 2. Tobacco Use Disorder:  Patient counseled for over 3 minutes and need for complete tobacco cessation. Continuing to use Chantix as previously prescribed. 3. Health Maintenance:  Unsure of vaccines. Obtaining records from his primary care physician's office. 4. Follow-up: Return to clinic in 6 weeks or sooner if needed.  Sonia Baller Ashok Cordia, M.D. [facility] Pager:  [phone] After 3pm or if no response, call [phone] 4:38 PM 08/15/16

## 2016-08-15 NOTE — Patient Instructions (Signed)
   Continue using the Breo & Spiriva inhalers as prescribed.  I will send in a Ventolin inhaler to replace your ProAir.  Call me if you have any questions or concerns before your next appointment.  TESTS ORDERED: 1. FULL PFTs before follow-up appointment 2. 6MWT on room air before follow-up appointment

## 2016-08-22 DIAGNOSIS — J322 Chronic ethmoidal sinusitis: Secondary | ICD-10-CM | POA: Diagnosis not present

## 2016-08-22 DIAGNOSIS — H6522 Chronic serous otitis media, left ear: Secondary | ICD-10-CM | POA: Diagnosis not present

## 2016-08-22 DIAGNOSIS — J320 Chronic maxillary sinusitis: Secondary | ICD-10-CM | POA: Diagnosis not present

## 2016-08-22 DIAGNOSIS — J301 Allergic rhinitis due to pollen: Secondary | ICD-10-CM | POA: Diagnosis not present

## 2016-09-06 DIAGNOSIS — J320 Chronic maxillary sinusitis: Secondary | ICD-10-CM | POA: Diagnosis not present

## 2016-09-06 DIAGNOSIS — H902 Conductive hearing loss, unspecified: Secondary | ICD-10-CM | POA: Diagnosis not present

## 2016-09-06 DIAGNOSIS — J410 Simple chronic bronchitis: Secondary | ICD-10-CM | POA: Diagnosis not present

## 2016-09-06 DIAGNOSIS — H6522 Chronic serous otitis media, left ear: Secondary | ICD-10-CM | POA: Diagnosis not present

## 2016-09-09 DIAGNOSIS — D582 Other hemoglobinopathies: Secondary | ICD-10-CM | POA: Diagnosis not present

## 2016-09-28 ENCOUNTER — Ambulatory Visit (INDEPENDENT_AMBULATORY_CARE_PROVIDER_SITE_OTHER): Payer: BLUE CROSS/BLUE SHIELD | Admitting: Pulmonary Disease

## 2016-09-28 ENCOUNTER — Other Ambulatory Visit: Payer: BLUE CROSS/BLUE SHIELD

## 2016-09-28 ENCOUNTER — Encounter: Payer: Self-pay | Admitting: Pulmonary Disease

## 2016-09-28 VITALS — BP 122/72 | HR 79 | Ht 67.0 in | Wt 182.0 lb

## 2016-09-28 DIAGNOSIS — J449 Chronic obstructive pulmonary disease, unspecified: Secondary | ICD-10-CM | POA: Diagnosis not present

## 2016-09-28 DIAGNOSIS — R0600 Dyspnea, unspecified: Secondary | ICD-10-CM

## 2016-09-28 DIAGNOSIS — F17200 Nicotine dependence, unspecified, uncomplicated: Secondary | ICD-10-CM | POA: Diagnosis not present

## 2016-09-28 LAB — PULMONARY FUNCTION TEST
DL/VA % PRED: 70 %
DL/VA: 3.11 ml/min/mmHg/L
DLCO COR % PRED: 66 %
DLCO COR: 18.77 ml/min/mmHg
DLCO UNC % PRED: 68 %
DLCO UNC: 19.33 ml/min/mmHg
FEF 25-75 PRE: 1.58 L/s
FEF 25-75 Post: 2.2 L/sec
FEF2575-%CHANGE-POST: 39 %
FEF2575-%PRED-POST: 84 %
FEF2575-%Pred-Pre: 60 %
FEV1-%Change-Post: 7 %
FEV1-%PRED-POST: 76 %
FEV1-%Pred-Pre: 71 %
FEV1-PRE: 2.26 L
FEV1-Post: 2.42 L
FEV1FVC-%CHANGE-POST: -5 %
FEV1FVC-%Pred-Pre: 96 %
FEV6-%CHANGE-POST: 11 %
FEV6-%PRED-POST: 86 %
FEV6-%PRED-PRE: 77 %
FEV6-PRE: 3.09 L
FEV6-Post: 3.46 L
FEV6FVC-%Change-Post: 0 %
FEV6FVC-%PRED-PRE: 104 %
FEV6FVC-%Pred-Post: 103 %
FVC-%Change-Post: 12 %
FVC-%Pred-Post: 83 %
FVC-%Pred-Pre: 73 %
FVC-Post: 3.52 L
FVC-Pre: 3.12 L
POST FEV6/FVC RATIO: 98 %
PRE FEV6/FVC RATIO: 99 %
Post FEV1/FVC ratio: 69 %
Pre FEV1/FVC ratio: 73 %
RV % PRED: 155 %
RV: 3.27 L
TLC % pred: 103 %
TLC: 6.64 L

## 2016-09-28 MED ORDER — ALBUTEROL SULFATE HFA 108 (90 BASE) MCG/ACT IN AERS: 2.0000 | INHALATION_SPRAY | RESPIRATORY_TRACT | 3 refills | Status: DC | PRN

## 2016-09-28 MED ORDER — FLUTICASONE FUROATE-VILANTEROL 200-25 MCG/INH IN AEPB: 1.0000 | INHALATION_SPRAY | Freq: Every day | RESPIRATORY_TRACT | 6 refills | Status: DC

## 2016-09-28 NOTE — Addendum Note (Signed)
Addended by: Tyson Dense on: 09/28/2016 02:05 PM   Modules accepted: Orders

## 2016-09-28 NOTE — Progress Notes (Signed)
Subjective:    Patient ID: Mitchell Dean, male    DOB: 06-14-1956, 61 y.o.   MRN: [id number]  C.C.:  Follow-up for Cough/Dyspnea & Tobacco Use Disorder.  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 Patient was previously given an Breo sample at last appointment. He reports both his cough & dyspnea were improved. He didn't continue taking it however due to lack of refills. He does continue to wheeze intermittently. He feels like dyspnea may be slightly worse today.   Tobacco use disorder:  Previously prescribed Chantix by outside physician. He has cut back to 1/2ppd of cigarettes. Previously was smoking 1-1.5 packs per day. He is still using Chantix. He is also using a nicotine patch.   Review of Systems Denies any chest pain or pressure. No fever or chills. No abdominal pain or nausea.   Allergies  Allergen Reactions  . Contrast Media [Iodinated Diagnostic Agents] Swelling    Current Outpatient Prescriptions on File Prior to Visit  Medication Sig Dispense Refill  . albuterol (PROVENTIL HFA;VENTOLIN HFA) 108 (90 Base) MCG/ACT inhaler Inhale 2 puffs into the lungs every 4 (four) hours as needed for wheezing or shortness of breath. 1 Inhaler 5  . ALPRAZolam (XANAX) 1 MG tablet Take 1 mg by mouth 3 (three) times daily as needed for anxiety.  5  . azithromycin (ZITHROMAX) 250 MG tablet Take 1 pill a day for four days 6 each 0  . FLUoxetine (PROZAC) 10 MG capsule Take 10 mg by mouth every morning.  0  . Multiple Vitamin (MULTIVITAMIN) tablet Take 1 tablet by mouth daily.    . nicotine (NICODERM CQ - DOSED IN MG/24 HOURS) 21 mg/24hr patch Place 1 patch (21 mg total) onto the skin daily. 28 patch 0  . polyethylene glycol (MIRALAX / GLYCOLAX) packet Take 17 g by mouth daily as needed for moderate constipation.    . varenicline (CHANTIX STARTING  MONTH PAK) 0.5 MG X 11 & 1 MG X 42 tablet Take one 0.5 mg tablet by mouth once daily for 3 days, then increase to one 0.5 mg tablet twice daily for 4 days, then increase to one 1 mg tablet twice daily. 53 tablet 0  . zolpidem (AMBIEN) 10 MG tablet Take 10 mg by mouth at bedtime.    Marland Kitchen albuterol (PROVENTIL HFA;VENTOLIN HFA) 108 (90 Base) MCG/ACT inhaler Inhale 2 puffs into the lungs every 4 (four) hours as needed for wheezing or shortness of breath. (Patient not taking: Reported on 09/28/2016) 1 Inhaler 2  . fluticasone furoate-vilanterol (BREO ELLIPTA) 200-25 MCG/INH AEPB Inhale 1 puff into the lungs daily. (Patient not taking: Reported on 09/28/2016) 60 each 0  . predniSONE (DELTASONE) 20 MG tablet Take 2 tablets (40 mg total) by mouth daily with breakfast. (Patient not taking: Reported on 09/28/2016) 6 tablet 0  . tiotropium (SPIRIVA HANDIHALER) 18 MCG inhalation capsule Place 1 capsule (18 mcg total) into inhaler and inhale daily. (Patient not taking: Reported on 09/28/2016) 30 capsule 0   No current facility-administered medications on file prior to visit.     Past Medical History:  Diagnosis Date  . Arthritis    hands  . Constipation   . Depression   . Mild acid reflux   . Prolapsed internal hemorrhoids     Past Surgical History:  Procedure Laterality Date  . APPENDECTOMY  2001  . HEMORRHOID SURGERY N/A 04/02/2014  Procedure: EXCISION OF INTERNAL HEMORRHOIDS;  Surgeon: Leighton Ruff, MD;  Location: [facility];  Service: General;  Laterality: N/A;  . I & D  RIGHT LEG INFECTION, EXTENSIVE  AGE 23  . LEG SURGERY Right     Family History  Problem Relation Age of Onset  . Emphysema Father   . Hypertension Neg Hx   . Cancer Neg Hx   . Stroke Neg Hx     Social History   Social History  . Marital status: Single    Spouse name: N/A  . Number of children: N/A  . Years of education: N/A   Social History Main Topics  . Smoking status: Current Every Day Smoker     Packs/day: 1.00    Years: 45.00    Types: Cigarettes    Start date: 02/06/1971  . Smokeless tobacco: Never Used     Comment: Has smoked 2-4 cigarettes per day recently. Peak rate of 2ppd.  . Alcohol use Yes     Comment: OCCASIONAL  . Drug use: No  . Sexual activity: Not Asked   Other Topics Concern  . None   Social History Narrative    Pulmonary (08/15/16):   Originally from [facility]. Previously worked in [employer]. Patient has lived in [place]. No pets currently. No mold or bird exposure.       Objective:   Physical Exam BP 122/72 (BP Location: Right Arm, Patient Position: Sitting, Cuff Size: Normal)   Pulse 79   Ht 5\' 7"  (1.702 m)   Wt 182 lb (82.6 kg)   SpO2 97%   BMI 28.51 kg/m   Gen.: No distress. Alert. Mild central obesity. Integument: No rash or bruising exposed skin. Warm and dry. HEENT: Moist mucous membranes. Minimal nasal turbinate swelling. No oral ulcers. Cardiovascular: Regular rate. Regular rhythm with normal S1 & S2. No edema. Pulmonary: Clear bilaterally to auscultation. No accessory muscle use on room air. Abdomen: Soft. Protuberant. Normal bowel sounds.  PFT 09/28/16: FVC 3.12 L (73%) FEV1 2.26 L (71%) FEV1/FVC 0.73 FEF 25-75 1.58 L (60%) positive bronchodilator response TLC 6.64 L (103%) RV 155% ERV 43% DLCO corrected 66%  6MWT 09/28/16:  Walked 240 meters / Baseline Sat 97% on RA / Nadir Sat 98% on RA (stopped w/ 20 seconds remaining due to back pain)  IMAGING CXR PA/LAT 08/03/16 (previously reviewed by me): Hyperinflation with flattening of the diaphragms. No focal parenchymal opacity or mass appreciated. Heart normal in size & mediastinum normal in contour. No pleural effusion.  MICROBIOLOGY Influenza PCR 08/04/16:  Negative   LABS 08/04/16 ABG on RA:  7.416 / 31.3 / 58.1  08/04/16 BNP:  27.9 CBC: 11.4/15.8/45.4/156    Assessment & Plan:  61 y.o. male with cough/dyspnea & tobacco use disorder. Patient spirometry today shows a mild  airway obstruction post bronchodilator challenge as well as a significant bronchodilator response. Air trapping on the patient's lung volumes is likely due to the airway dysfunction. Given the patient's significant symptomatic response to inhaled corticosteroid and long-acting beta agonist therapy I do feel that reinitiating his inhaler regimen is reasonable at this time. We did discuss proper technique and oral hygiene with inhaled corticosteroid therapy. I did spend a significant amount time reviewing the pathophysiology of his airways dysfunction and the likelihood that this will continue to progress if he does not cease his tobacco use. I instructed the patient contact my office if he had any new breathing problems or questions before next appointment.  1. COPD mixed type: Starting the patient on Breo 200. Refill sent in for his albuterol rescue inhaler as well. Screening for alpha-1 antitrypsin deficiency. Repeat spirometry with bronchodilator challenge at next appointment to ensure maximal bronchodilatation. 2. Tobacco use disorder: Spent over 3 minutes counseling the patient and the need for complete tobacco cessation to prevent worsening of lung function. 3. Health maintenance: Requested the patient obtain records of his previous immunizations for our review. 4. Follow-up: Return to clinic in 3 months or sooner if needed.  Sonia Baller Ashok Cordia, M.D. [facility] Pulmonary & Critical Care Pager:  [phone] After 3pm or if no response, call [phone] 1:56 PM 09/28/16

## 2016-09-28 NOTE — Progress Notes (Signed)
PFT done today. 

## 2016-09-28 NOTE — Progress Notes (Signed)
Test reviewed.  

## 2016-09-28 NOTE — Patient Instructions (Addendum)
   I'm restarting your Breo inhaler. Remember to brush your teeth, brush your tongue, rinse, gargle, & spit to keep from getting thrush after you use the Breo.  We are sending in refills on your rescue/Albuterol inhaler today as well.  Call me if you have any new breathing problems before your next appointment.  Remember to check with your primary doctor to see what immunizations you have had.   Tests Ordered: 1. Spirometry with bronchodilator challenge at next appointment 2. Alpha-1 Antitrypsin Phenotype today

## 2016-10-01 LAB — ALPHA-1 ANTITRYPSIN PHENOTYPE: A1 ANTITRYPSIN: 137 mg/dL (ref 83–199)

## 2016-12-22 DIAGNOSIS — F329 Major depressive disorder, single episode, unspecified: Secondary | ICD-10-CM | POA: Diagnosis not present

## 2016-12-22 DIAGNOSIS — N5201 Erectile dysfunction due to arterial insufficiency: Secondary | ICD-10-CM | POA: Diagnosis not present

## 2016-12-22 DIAGNOSIS — F419 Anxiety disorder, unspecified: Secondary | ICD-10-CM | POA: Diagnosis not present

## 2016-12-22 DIAGNOSIS — E7800 Pure hypercholesterolemia, unspecified: Secondary | ICD-10-CM | POA: Diagnosis not present

## 2016-12-22 DIAGNOSIS — G4700 Insomnia, unspecified: Secondary | ICD-10-CM | POA: Diagnosis not present

## 2016-12-22 DIAGNOSIS — D582 Other hemoglobinopathies: Secondary | ICD-10-CM | POA: Diagnosis not present

## 2016-12-30 ENCOUNTER — Encounter: Payer: Self-pay | Admitting: Pulmonary Disease

## 2016-12-30 ENCOUNTER — Ambulatory Visit (INDEPENDENT_AMBULATORY_CARE_PROVIDER_SITE_OTHER): Payer: BLUE CROSS/BLUE SHIELD | Admitting: Pulmonary Disease

## 2016-12-30 VITALS — BP 102/78 | HR 84 | Ht 67.0 in | Wt 191.4 lb

## 2016-12-30 DIAGNOSIS — J449 Chronic obstructive pulmonary disease, unspecified: Secondary | ICD-10-CM

## 2016-12-30 DIAGNOSIS — F17200 Nicotine dependence, unspecified, uncomplicated: Secondary | ICD-10-CM | POA: Diagnosis not present

## 2016-12-30 LAB — PULMONARY FUNCTION TEST
FEF 25-75 Post: 1.16 L/sec
FEF 25-75 Pre: 1.03 L/sec
FEF2575-%CHANGE-POST: 12 %
FEF2575-%PRED-POST: 43 %
FEF2575-%Pred-Pre: 38 %
FEV1-%CHANGE-POST: 3 %
FEV1-%Pred-Post: 64 %
FEV1-%Pred-Pre: 62 %
FEV1-Post: 2.07 L
FEV1-Pre: 2.01 L
FEV1FVC-%Change-Post: -2 %
FEV1FVC-%Pred-Pre: 88 %
FEV6-%Change-Post: 7 %
FEV6-%PRED-POST: 76 %
FEV6-%Pred-Pre: 71 %
FEV6-PRE: 2.93 L
FEV6-Post: 3.14 L
FEV6FVC-%Change-Post: -1 %
FEV6FVC-%PRED-PRE: 104 %
FEV6FVC-%Pred-Post: 102 %
FVC-%CHANGE-POST: 6 %
FVC-%PRED-POST: 74 %
FVC-%Pred-Pre: 70 %
FVC-POST: 3.21 L
FVC-Pre: 3.02 L
POST FEV1/FVC RATIO: 64 %
Post FEV6/FVC ratio: 98 %
Pre FEV1/FVC ratio: 66 %
Pre FEV6/FVC Ratio: 99 %

## 2016-12-30 MED ORDER — FLUTICASONE-UMECLIDIN-VILANT 100-62.5-25 MCG/INH IN AEPB
1.0000 | INHALATION_SPRAY | Freq: Every day | RESPIRATORY_TRACT | 0 refills | Status: DC
Start: 2016-12-30 — End: 2017-02-10

## 2016-12-30 NOTE — Progress Notes (Signed)
PFT done today. 

## 2016-12-30 NOTE — Addendum Note (Signed)
Addended by: Jannette Spanner on: 12/30/2016 11:09 AM   Modules accepted: Orders

## 2016-12-30 NOTE — Patient Instructions (Signed)
   We are going to try you on Trelegy in place of your Breo. We are giving you a sample today.  Remember to remove any dentures or partials you have before you use your inhaler. Remember to brush your teeth & tongue after you use your inhaler as well as rinse, gargle & spit to keep from getting thrush in your mouth or on your tongue (a white film).   Do 1 inhalation of your Trelegy sample once daily, just like your Breo.  Call me for a prescription for Trelegy if this seems to help your coughing & wheezing more than just the Breo.  I will see you back in 6 months or sooner if needed.

## 2016-12-30 NOTE — Progress Notes (Signed)
Subjective:    Patient ID: Mitchell Dean, male    DOB: 1955/11/08, 61 y.o.   MRN: [id number]  C.C.:  Follow-up for Mixed Type COPD & Tobacco Use Disorder.  HPI  Mixed type COPD: Previously started on Breo. He isn't sure if his wheezing & cough have improved. He is still coughing up a gray mucus. Denies any hemoptysis. Still has dyspnea on exertion. He does feel his rescue inhaler helps his symptoms but uses it sparingly.   Tobacco use disorder: Previously prescribed Chantix by outside physician. At last appointment patient had cut back to 1/2 ppd of cigarettes. Patient was still using Chantix as well as a nicotine patch. He is limiting himself to 10 cigarettes daily. He reports he has cut the patch out totally but is still taking Chantix.   Review of Systems He has gained 14 pounds in the last couple of months. No fever, chills, or sweats. He reports he is still having eye watering and some mild, intermittent dry mouth. He reports some rare chest tightness with exertion but no pain or pressure. No abdominal pain or nausea.   Allergies  Allergen Reactions  . Contrast Media [Iodinated Diagnostic Agents] Swelling    Current Outpatient Prescriptions on File Prior to Visit  Medication Sig Dispense Refill  . albuterol (PROVENTIL HFA;VENTOLIN HFA) 108 (90 Base) MCG/ACT inhaler Inhale 2 puffs into the lungs every 4 (four) hours as needed for wheezing or shortness of breath. 1 Inhaler 2  . albuterol (PROVENTIL HFA;VENTOLIN HFA) 108 (90 Base) MCG/ACT inhaler Inhale 2 puffs into the lungs every 4 (four) hours as needed for wheezing or shortness of breath. 1 Inhaler 3  . ALPRAZolam (XANAX) 1 MG tablet Take 1 mg by mouth 3 (three) times daily as needed for anxiety.  5  . FLUoxetine (PROZAC) 10 MG capsule Take 10 mg by mouth every morning.  0  . fluticasone furoate-vilanterol (BREO ELLIPTA) 200-25 MCG/INH AEPB Inhale 1 puff into the lungs daily. 1 each 6  . Multiple Vitamin (MULTIVITAMIN) tablet Take  1 tablet by mouth daily.    Marland Kitchen zolpidem (AMBIEN) 10 MG tablet Take 10 mg by mouth at bedtime.    . nicotine (NICODERM CQ - DOSED IN MG/24 HOURS) 21 mg/24hr patch Place 1 patch (21 mg total) onto the skin daily. (Patient not taking: Reported on 12/30/2016) 28 patch 0  . polyethylene glycol (MIRALAX / GLYCOLAX) packet Take 17 g by mouth daily as needed for moderate constipation.    Marland Kitchen tiotropium (SPIRIVA HANDIHALER) 18 MCG inhalation capsule Place 1 capsule (18 mcg total) into inhaler and inhale daily. (Patient not taking: Reported on 09/28/2016) 30 capsule 0   No current facility-administered medications on file prior to visit.     Past Medical History:  Diagnosis Date  . Arthritis    hands  . Constipation   . Depression   . Mild acid reflux   . Prolapsed internal hemorrhoids     Past Surgical History:  Procedure Laterality Date  . APPENDECTOMY  2001  . HEMORRHOID SURGERY N/A 04/02/2014   Procedure: EXCISION OF INTERNAL HEMORRHOIDS;  Surgeon: Leighton Ruff, MD;  Location: [facility];  Service: General;  Laterality: N/A;  . I & D  RIGHT LEG INFECTION, EXTENSIVE  AGE 41  . LEG SURGERY Right     Family History  Problem Relation Age of Onset  . Emphysema Father   . Hypertension Neg Hx   . Cancer Neg Hx   . Stroke Neg  Hx     Social History   Social History  . Marital status: Single    Spouse name: N/A  . Number of children: N/A  . Years of education: N/A   Social History Main Topics  . Smoking status: Current Every Day Smoker    Packs/day: 1.00    Years: 45.00    Types: Cigarettes    Start date: 02/06/1971  . Smokeless tobacco: Never Used     Comment: Has smoked 2-4 cigarettes per day recently. Peak rate of 2ppd.  . Alcohol use Yes     Comment: OCCASIONAL  . Drug use: No  . Sexual activity: Not Asked   Other Topics Concern  . None   Social History Narrative   [facility] (08/15/16):   Originally from [facility]. Previously worked in [employer].  Patient has lived in [place]. No pets currently. No mold or bird exposure.       Objective:   Physical Exam BP 102/78 (BP Location: Left Arm, Patient Position: Sitting, Cuff Size: Normal)   Pulse 84   Ht 5\' 7"  (1.702 m)   Wt 191 lb 6.4 oz (86.8 kg)   SpO2 96%   BMI 29.98 kg/m   General:  Awake. Alert. No acute distress. Obese. Integument:  Warm & dry. No rash on exposed skin. No bruising on exposed skin. Extremities:  No cyanosis or clubbing.  HEENT:  Moist mucus membranes. No oral ulcers. No scleral injection or icterus. Minimal bilateral nasal turbinate swelling. Cardiovascular:  Regular rate. Trace lower extremity edema. No appreciable JVD.  Pulmonary:  Good aeration & clear to auscultation bilaterally. Symmetric chest wall expansion. No accessory muscle use on room air. Abdomen: Soft. Normal bowel sounds. Protuberant. Musculoskeletal:  Normal bulk and tone. No joint deformity or effusion appreciated.  PFT 12/30/16: FVC 3.02 L (70%) FEV1 2.01 L (62%) FEV1/FVC 0.66 FEF 25-75 1.03 L (38%) negative bronchodilator response 09/28/16: FVC 3.12 L (73%) FEV1 2.26 L (71%) FEV1/FVC 0.73 FEF 25-75 1.58 L (60%) positive bronchodilator response TLC 6.64 L (103%) RV 155% ERV 43% DLCO corrected 66%  6MWT 09/28/16:  Walked 240 meters / Baseline Sat 97% on RA / Nadir Sat 98% on RA (stopped w/ 20 seconds remaining due to back pain)  IMAGING CXR PA/LAT 08/03/16 (previously reviewed by me): Hyperinflation with flattening of the diaphragms. No focal parenchymal opacity or mass appreciated. Heart normal in size & mediastinum normal in contour. No pleural effusion.  MICROBIOLOGY Influenza PCR 08/04/16:  Negative   LABS 09/28/16 Alpha-1 antitrypsin: MM (137)  08/04/16 ABG on RA:  7.416 / 31.3 / 58.1  08/04/16 BNP:  27.9 CBC: 11.4/15.8/45.4/156    Assessment & Plan:  61 y.o. male with mixed type COPD and tobacco use disorder. Patient spirometry has significantly worsened since previous testing.  This is likely due to his weight gain. Additionally, his bronchodilator response has resolved and I attribute this to his daily use of Breo. Certainly his ongoing symptoms are likely due to his ongoing tobacco use which we discussed at length. He may benefit from a long-acting muscarinic antagonist and previously did tolerate Spiriva. I instructed the patient to contact me if he had any new breathing problems or questions before his next appointment.  1. Mixed type COPD: Trying patient on Trelegy in place of Breo. Patient will contact me for prescription if this is more effective at symptom control. 2. Obesity: Did discuss limiting caloric intake with liquids and other dietary modifications to help the  patient lose weight. 3. Tobacco use disorder: Patient counseled for over 3 minutes the need for complete tobacco cessation. Continuing Chantix. 4. Health maintenance: I am to readdress at next appointment. 5. Follow-up: Return to clinic in 6 months or sooner if needed.  Sonia Baller Ashok Cordia, M.D. [facility] Pulmonary & Critical Care Pager:  [phone] After 3pm or if no response, call [phone] 10:04 AM 12/30/16

## 2017-02-02 ENCOUNTER — Encounter: Payer: Self-pay | Admitting: Hematology and Oncology

## 2017-02-02 ENCOUNTER — Telehealth: Payer: Self-pay | Admitting: Hematology and Oncology

## 2017-02-02 NOTE — Telephone Encounter (Signed)
Appt has been scheduled for the Mitchell Dean to see Dr. Lebron Conners on 8/3 at [facility] for hematology. Address updated and insurance verified. Letter mailed to the Mitchell Dean and faxed to the referring.

## 2017-02-10 ENCOUNTER — Ambulatory Visit (HOSPITAL_BASED_OUTPATIENT_CLINIC_OR_DEPARTMENT_OTHER): Payer: BLUE CROSS/BLUE SHIELD | Admitting: Hematology and Oncology

## 2017-02-10 ENCOUNTER — Telehealth: Payer: Self-pay | Admitting: Hematology and Oncology

## 2017-02-10 ENCOUNTER — Ambulatory Visit (HOSPITAL_BASED_OUTPATIENT_CLINIC_OR_DEPARTMENT_OTHER): Payer: BLUE CROSS/BLUE SHIELD

## 2017-02-10 ENCOUNTER — Encounter: Payer: Self-pay | Admitting: Hematology and Oncology

## 2017-02-10 VITALS — BP 99/69 | HR 163 | Temp 98.3°F | Resp 20 | Ht 67.0 in | Wt 186.5 lb

## 2017-02-10 DIAGNOSIS — J449 Chronic obstructive pulmonary disease, unspecified: Secondary | ICD-10-CM

## 2017-02-10 DIAGNOSIS — R718 Other abnormality of red blood cells: Secondary | ICD-10-CM

## 2017-02-10 DIAGNOSIS — D751 Secondary polycythemia: Secondary | ICD-10-CM | POA: Diagnosis not present

## 2017-02-10 DIAGNOSIS — F17200 Nicotine dependence, unspecified, uncomplicated: Secondary | ICD-10-CM | POA: Diagnosis not present

## 2017-02-10 DIAGNOSIS — D72829 Elevated white blood cell count, unspecified: Secondary | ICD-10-CM | POA: Diagnosis not present

## 2017-02-10 LAB — CBC & DIFF AND RETIC
BASO%: 0.1 % (ref 0.0–2.0)
Basophils Absolute: 0 10*3/uL (ref 0.0–0.1)
EOS ABS: 0.1 10*3/uL (ref 0.0–0.5)
EOS%: 0.7 % (ref 0.0–7.0)
HCT: 54.4 % — ABNORMAL HIGH (ref 38.4–49.9)
HGB: 19.2 g/dL — ABNORMAL HIGH (ref 13.0–17.1)
IMMATURE RETIC FRACT: 2.5 % — AB (ref 3.00–10.60)
LYMPH%: 21.4 % (ref 14.0–49.0)
MCH: 32.2 pg (ref 27.2–33.4)
MCHC: 35.3 g/dL (ref 32.0–36.0)
MCV: 91.3 fL (ref 79.3–98.0)
MONO#: 1 10*3/uL — AB (ref 0.1–0.9)
MONO%: 9.4 % (ref 0.0–14.0)
NEUT%: 68.4 % (ref 39.0–75.0)
NEUTROS ABS: 7.3 10*3/uL — AB (ref 1.5–6.5)
NRBC: 0 % (ref 0–0)
Platelets: 168 10*3/uL (ref 140–400)
RBC: 5.96 10*6/uL — AB (ref 4.20–5.82)
RDW: 14.3 % (ref 11.0–14.6)
Retic %: 1.6 % (ref 0.80–1.80)
Retic Ct Abs: 95.36 10*3/uL — ABNORMAL HIGH (ref 34.80–93.90)
WBC: 10.7 10*3/uL — AB (ref 4.0–10.3)
lymph#: 2.3 10*3/uL (ref 0.9–3.3)

## 2017-02-10 LAB — COMPREHENSIVE METABOLIC PANEL
ALK PHOS: 96 U/L (ref 40–150)
ALT: 27 U/L (ref 0–55)
ANION GAP: 9 meq/L (ref 3–11)
AST: 19 U/L (ref 5–34)
Albumin: 3.8 g/dL (ref 3.5–5.0)
BILIRUBIN TOTAL: 0.72 mg/dL (ref 0.20–1.20)
BUN: 14.3 mg/dL (ref 7.0–26.0)
CALCIUM: 9.6 mg/dL (ref 8.4–10.4)
CHLORIDE: 105 meq/L (ref 98–109)
CO2: 23 meq/L (ref 22–29)
Creatinine: 1.2 mg/dL (ref 0.7–1.3)
EGFR: 66 mL/min/{1.73_m2} — AB (ref >90)
Glucose: 104 mg/dl (ref 70–140)
POTASSIUM: 4.1 meq/L (ref 3.5–5.1)
Sodium: 138 mEq/L (ref 136–145)
Total Protein: 6.8 g/dL (ref 6.4–8.3)

## 2017-02-10 LAB — LACTATE DEHYDROGENASE: LDH: 181 U/L (ref 125–245)

## 2017-02-10 NOTE — Progress Notes (Signed)
[facility] New Visit:  Assessment: Erythrocytosis 61 year old male with extensive smoking history and recent diagnosis of COPD refer to the clinic for elevated hemoglobin value and so off additional hematological abnormalities or significant symptoms.  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 Left, primary erythrocytosis cannot be fully excluded. The simplest strategy for assessment would be to obtain a an erythropoietin level. In case of primary bone marrow dysfunction such as polycythemia vera erythropoietin is usually depressed, but in pulmonary diseases normal or elevated. Once the values are obtained, we'll meet with the patient again and discuss further need for evaluation and management.  Plan: --Labs today --Return to clinic in 1 week for review  Voice recognition software was used and creation of this note. Despite my best effort at editing the text, some misspelling/errors may have occurred.   Orders Placed This Encounter  Procedures  . CBC & Diff and Retic    Standing Status:   Future    Number of Occurrences:   1    Standing Expiration Date:   02/10/2018  . Lactate dehydrogenase (LDH)    Standing Status:   Future    Number of Occurrences:   1    Standing Expiration Date:   02/10/2018  . Comprehensive metabolic panel    Standing Status:   Future    Number of Occurrences:   1    Standing Expiration Date:   02/10/2018  . Erythropoietin    Standing Status:   Future    Number of Occurrences:   1    Standing Expiration Date:   02/10/2018    All questions were answered.  . The patient knows to call the clinic with any problems, questions or concerns.  This note was electronically signed.    History of Presenting Illness Mitchell Dean 61 y.o. presenting to the [facility] for Evaluation of elevated white blood cell count and hemoglobin. Patient is a long-term smoker,  continues currently to smoke, but is trying to reduce the number of cigarettes per day. He was recently diagnosed with COPD and is currently undergoing management by pulmonology. Patient denies any significant changes to his health other than the respiratory symptoms. Denies any fevers, chills, night sweats. No decrease in appetite, no facial flushing, pain or itching or burning in the extremities. He denies any nausea, vomiting, early satiety, diarrhea, or constipation..  Oncological/hematological History: --Labs, 08/03/16: WBC 16.0, Hgb 19.5, MCV 89.9, MCH 32.2, RDW 13.9, Plt 177; --Labs, 08/11/16: WBC 9.5, Hgb 18.4, MCV 91.0, MCH 30.9, RDW 14.0, Plt 194;  --Labs, 09/09/16: WBC 8.8, Hgb 17.9, MCV 91.8, MCH 31.7, RDW 14.3, Plt 177; --Labs, 12/22/16: WBC 8.7, Hgb 18.1, MCV 91.8, MCH 31.6, RDW 14.6, Plt 186;   Medical History: Past Medical History:  Diagnosis Date  . Arthritis    hands  . Constipation   . COPD (chronic obstructive pulmonary disease) ([place])   . Depression   . Mild acid reflux    Minimal  . Prolapsed internal hemorrhoids     Surgical History: Past Surgical History:  Procedure Laterality Date  . APPENDECTOMY  2001  . HEMORRHOID SURGERY N/A 04/02/2014   Procedure: EXCISION OF INTERNAL HEMORRHOIDS;  Surgeon: Leighton Ruff, MD;  Location: [facility];  Service: General;  Laterality: N/A;  . I & D  RIGHT LEG INFECTION, EXTENSIVE  AGE 44  . LEG SURGERY Right     Family History: Family History  Problem Relation Age of Onset  . Emphysema Father   . Hypertension Neg Hx   . Cancer Neg Hx   . Stroke Neg Hx     Social History: Social History   Social History  . Marital status: Single    Spouse name: N/A  . Number of children: N/A  . Years of education: N/A   Occupational History  . Not on file.   Social History Main Topics  . Smoking status: Current Every Day Smoker    Packs/day: 0.50    Years: 45.00    Types: Cigarettes    Start date: 02/06/1971   . Smokeless tobacco: Never Used     Comment: 8-9 cigarettes/day  . Alcohol use Yes     Comment: OCCASIONAL  . Drug use: No  . Sexual activity: Not on file   Other Topics Concern  . Not on file   Social History Narrative   [facility] (08/15/16):   Originally from [facility]. Previously worked in [employer]. Patient has lived in [place]. No pets currently. No mold or bird exposure.     Allergies: Allergies  Allergen Reactions  . Contrast Media [Iodinated Diagnostic Agents] Swelling    Eye swelling    Medications:  Current Outpatient Prescriptions  Medication Sig Dispense Refill  . albuterol (PROVENTIL HFA;VENTOLIN HFA) 108 (90 Base) MCG/ACT inhaler Inhale 2 puffs into the lungs every 4 (four) hours as needed for wheezing or shortness of breath. 1 Inhaler 2  . ALPRAZolam (XANAX) 1 MG tablet Take 1 mg by mouth 3 (three) times daily as needed for anxiety.  5  . CHANTIX 1 MG tablet Take 1 mg by mouth 2 (two) times daily.  3  . FLUoxetine (PROZAC) 10 MG capsule Take 10 mg by mouth every morning.  0  . fluticasone furoate-vilanterol (BREO ELLIPTA) 200-25 MCG/INH AEPB Inhale 1 puff into the lungs daily. 1 each 6  . Multiple Vitamin (MULTIVITAMIN) tablet Take 1 tablet by mouth daily.    Marland Kitchen tiotropium (SPIRIVA HANDIHALER) 18 MCG inhalation capsule Place 1 capsule (18 mcg total) into inhaler and inhale daily. (Patient not taking: Reported on 09/28/2016) 30 capsule 0  . zolpidem (AMBIEN) 10 MG tablet Take 10 mg by mouth at bedtime.     No current facility-administered medications for this visit.     Review of Systems: Review of Systems  All other systems reviewed and are negative.    PHYSICAL EXAMINATION Blood pressure 99/69, pulse (!) 163, temperature 98.3 F (36.8 C), temperature source Oral, resp. rate 20, height '5\' 7"'  (1.702 m), weight 186 lb 8 oz (84.6 kg), SpO2 99 %.  ECOG PERFORMANCE STATUS: 0 - Asymptomatic  Physical Exam  Constitutional: He is oriented to person,  place, and time and well-developed, well-nourished, and in no distress. No distress.  HENT:  Head: Normocephalic and atraumatic.  Mouth/Throat: Oropharynx is clear and moist. No oropharyngeal exudate.  Eyes: Pupils are equal, round, and reactive to light. EOM are normal. No scleral icterus.  Neck: Normal range of motion. No JVD present. No thyromegaly present.  Cardiovascular: Normal rate and regular rhythm.  Exam reveals no friction rub.   No murmur heard. Pulmonary/Chest: Effort normal and breath sounds normal. He has no wheezes. He has no rales.  Abdominal: Soft. He exhibits no distension and no mass. There is no tenderness. There is no guarding.  Musculoskeletal: Normal range of motion. He exhibits no edema.  Lymphadenopathy:    He has no cervical adenopathy.  Neurological: He is alert and oriented to person, place, and time. He has normal reflexes. No cranial nerve deficit.  Skin: Skin is warm and dry. He is not diaphoretic. No erythema. No pallor.     LABORATORY DATA: I have personally reviewed the data as listed: Appointment on 02/10/2017  Component Date Value Ref Range Status  . WBC 02/10/2017 10.7* 4.0 - 10.3 10e3/uL Final  . NEUT# 02/10/2017 7.3* 1.5 - 6.5 10e3/uL Final  . HGB 02/10/2017 19.2* 13.0 - 17.1 g/dL Final  . HCT 02/10/2017 54.4* 38.4 - 49.9 % Final  . Platelets 02/10/2017 168  140 - 400 10e3/uL Final  . MCV 02/10/2017 91.3  79.3 - 98.0 fL Final  . MCH 02/10/2017 32.2  27.2 - 33.4 pg Final  . MCHC 02/10/2017 35.3  32.0 - 36.0 g/dL Final  . RBC 02/10/2017 5.96* 4.20 - 5.82 10e6/uL Final  . RDW 02/10/2017 14.3  11.0 - 14.6 % Final  . lymph# 02/10/2017 2.3  0.9 - 3.3 10e3/uL Final  . MONO# 02/10/2017 1.0* 0.1 - 0.9 10e3/uL Final  . Eosinophils Absolute 02/10/2017 0.1  0.0 - 0.5 10e3/uL Final  . Basophils Absolute 02/10/2017 0.0  0.0 - 0.1 10e3/uL Final  . NEUT% 02/10/2017 68.4  39.0 - 75.0 % Final  . LYMPH% 02/10/2017 21.4  14.0 - 49.0 % Final  . MONO% 02/10/2017  9.4  0.0 - 14.0 % Final  . EOS% 02/10/2017 0.7  0.0 - 7.0 % Final  . BASO% 02/10/2017 0.1  0.0 - 2.0 % Final  . nRBC 02/10/2017 0  0 - 0 % Final  . Retic % 02/10/2017 1.60  0.80 - 1.80 % Final  . Retic Ct Abs 02/10/2017 95.36* 34.80 - 93.90 10e3/uL Final  . Immature Retic Fract 02/10/2017 2.50* 3.00 - 10.60 % Final  . LDH 02/10/2017 181  125 - 245 U/L Final  . Sodium 02/10/2017 138  136 - 145 mEq/L Final  . Potassium 02/10/2017 4.1  3.5 - 5.1 mEq/L Final  . Chloride 02/10/2017 105  98 - 109 mEq/L Final  . CO2 02/10/2017 23  22 - 29 mEq/L Final  . Glucose 02/10/2017 104  70 - 140 mg/dl Final   Glucose reference range is for nonfasting patients. Fasting glucose reference range is 70- 100.  Marland Kitchen BUN 02/10/2017 14.3  7.0 - 26.0 mg/dL Final  . Creatinine 02/10/2017 1.2  0.7 - 1.3 mg/dL Final  . Total Bilirubin 02/10/2017 0.72  0.20 - 1.20 mg/dL Final  . Alkaline Phosphatase 02/10/2017 96  40 - 150 U/L Final  . AST 02/10/2017 19  5 - 34 U/L Final  . ALT 02/10/2017 27  0 - 55 U/L Final  . Total Protein 02/10/2017 6.8  6.4 - 8.3 g/dL Final  . Albumin 02/10/2017 3.8  3.5 - 5.0 g/dL Final  . Calcium 02/10/2017 9.6  8.4 - 10.4 mg/dL Final  . Anion Gap 02/10/2017 9  3 - 11 mEq/L Final  . EGFR 02/10/2017 66* >90 ml/min/1.73 m2 Final   eGFR is calculated using the CKD-EPI Creatinine Equation (2009)         Ardath Sax, MD

## 2017-02-10 NOTE — Telephone Encounter (Signed)
Gave patient avs form and calendar for August.

## 2017-02-10 NOTE — Assessment & Plan Note (Signed)
61 year old male with extensive smoking history and recent diagnosis of COPD refer to the clinic for elevated hemoglobin value and so off additional hematological abnormalities or significant symptoms.  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 Left, primary erythrocytosis cannot be fully excluded. The simplest strategy for assessment would be to obtain a an erythropoietin level. In case of primary bone marrow dysfunction such as polycythemia vera erythropoietin is usually depressed, but in pulmonary diseases normal or elevated. Once the values are obtained, we'll meet with the patient again and discuss further need for evaluation and management.  Plan: --Labs today --Return to clinic in 1 week for review  Voice recognition software was used and creation of this note. Despite my best effort at editing the text, some misspelling/errors may have occurred.

## 2017-02-11 LAB — ERYTHROPOIETIN: ERYTHROPOIETIN: 25.8 m[IU]/mL — AB (ref 2.6–18.5)

## 2017-02-15 ENCOUNTER — Ambulatory Visit (HOSPITAL_BASED_OUTPATIENT_CLINIC_OR_DEPARTMENT_OTHER): Payer: BLUE CROSS/BLUE SHIELD | Admitting: Hematology and Oncology

## 2017-02-15 ENCOUNTER — Telehealth: Payer: Self-pay | Admitting: Hematology and Oncology

## 2017-02-15 VITALS — BP 123/82 | HR 85 | Temp 98.0°F | Resp 18 | Ht 67.0 in | Wt 193.2 lb

## 2017-02-15 DIAGNOSIS — F17200 Nicotine dependence, unspecified, uncomplicated: Secondary | ICD-10-CM

## 2017-02-15 DIAGNOSIS — J449 Chronic obstructive pulmonary disease, unspecified: Secondary | ICD-10-CM | POA: Diagnosis not present

## 2017-02-15 DIAGNOSIS — D751 Secondary polycythemia: Secondary | ICD-10-CM | POA: Diagnosis not present

## 2017-02-15 NOTE — Telephone Encounter (Signed)
Scheduled appt per 8/8 los - Gave patient AVS and calender per los. - Central radiology to contact patient with MRI schedule.

## 2017-02-17 NOTE — Assessment & Plan Note (Addendum)
61-year-old male with extensive smoking history and recent diagnosis of COPD refer to the clinic for elevated hemoglobin value and leukocytosis. Upon our recent visit, we'll have him obtain additional lab work which demonstrated persistent elevated hemoglobin and leukocytosis. Platelets are normal. We have demonstrated elevated level of erythropoietin. In this setting, we are unlikely to be dealing with a primary erythrocytosis or primary myeloproliferative neoplasm as those usually are accompanied by suppression of the erythropoietin production. 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  Elevated erythropoietin also may accompany some malignancies including renal cell carcinoma and hepatocellular carcinoma. We will evaluate for those with imaging.   Plan: --MRI abdomen to r/o HCC and RCC --RTC 1 wk after the imaging  Voice recognition software was used and creation of this note. Despite my best effort at editing the text, some misspelling/errors may have occurred. 

## 2017-02-17 NOTE — Progress Notes (Signed)
[facility] Follow-up Visit:  Assessment: Erythrocytosis 61 year old male with extensive smoking history and recent diagnosis of COPD refer to the clinic for elevated hemoglobin value and leukocytosis. Upon our recent visit, we'll have him obtain additional lab work which demonstrated persistent elevated hemoglobin and leukocytosis. Platelets are normal. We have demonstrated elevated level of erythropoietin. In this setting, we are unlikely to be dealing with a primary erythrocytosis or primary myeloproliferative neoplasm as those usually are accompanied by suppression of the erythropoietin production. 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  Elevated erythropoietin also may accompany some malignancies including renal cell carcinoma and hepatocellular carcinoma. We will evaluate for those with imaging.   Plan: --MRI abdomen to r/o HCC and RCC --RTC 1 wk after the imaging  Voice recognition software was used and creation of this note. Despite my best effort at editing the text, some misspelling/errors may have occurred.   Orders Placed This Encounter  Procedures  . MR Abdomen W Wo Contrast    Standing Status:   Future    Standing Expiration Date:   04/17/2018    Order Specific Question:   If indicated for the ordered procedure, I authorize the administration of contrast media per Radiology protocol    Answer:   Yes    Order Specific Question:   What is the patient's sedation requirement?    Answer:   No Sedation    Order Specific Question:   Does the patient have a pacemaker or implanted devices?    Answer:   No    Order Specific Question:   Radiology Contrast Protocol - do NOT remove file path    Answer:   [email or web address]    Order Specific Question:   Reason for Exam additional comments    Answer:   Elevated Hgb with elevated Epo -- please eval for renal or hepatic masses     Order Specific Question:   Preferred imaging location?    Answer:   [facility] (table limit-350 lbs)    Cancer Staging No matching staging information was found for the patient.  All questions were answered.  . The patient knows to call the clinic with any problems, questions or concerns.  This note was electronically signed.    History of Presenting Illness Mitchell Dean 61 y.o. followed in the [facility] for evaluation of elevated white blood cell count and hemoglobin. Patient is a long-term smoker, continues currently to smoke, but is trying to reduce the number of cigarettes per day. He was recently diagnosed with COPD and is currently undergoing management by pulmonology. Patient denies any significant changes to his health other than the respiratory symptoms. Denies any fevers, chills, night sweats. No decrease in appetite, no facial flushing, pain or itching or burning in the extremities. He denies any nausea, vomiting, early satiety, diarrhea, or constipation.  Patient presents to the clinic to review the results of recently obtained lab work. He denies any new complaints since last visit to the clinic.  Oncological/hematological History: --Labs, 08/03/16: WBC 16.0, Hgb 19.5, MCV 89.9, MCH 32.2, RDW 13.9, Plt 177; --Labs, 08/11/16: WBC 9.5, Hgb 18.4, MCV 91.0, MCH 30.9, RDW 14.0, Plt 194;  --Labs, 09/09/16: WBC 8.8, Hgb 17.9, MCV 91.8, MCH 31.7, RDW 14.3, Plt 177; --Labs, 12/22/16: WBC 8.7, Hgb 18.1, MCV 91.8, MCH 31.6, RDW 14.6, Plt 186;  --Labs, 02/10/17: WBC 10.7, Hgb 19.2, MCV 91.3, MCH 32.2, RDW 14.3, Plt 168; Epo  25.8(up)    Medical History: Past Medical History:  Diagnosis Date  . Arthritis    hands  . Constipation   . COPD (chronic obstructive pulmonary disease) ([place])   . Depression   . Mild acid reflux    Minimal  . Prolapsed internal hemorrhoids     Surgical History: Past Surgical History:  Procedure Laterality Date  . APPENDECTOMY  2001   . HEMORRHOID SURGERY N/A 04/02/2014   Procedure: EXCISION OF INTERNAL HEMORRHOIDS;  Surgeon: Leighton Ruff, MD;  Location: [facility];  Service: General;  Laterality: N/A;  . I & D  RIGHT LEG INFECTION, EXTENSIVE  AGE 4  . LEG SURGERY Right     Family History: Family History  Problem Relation Age of Onset  . Emphysema Father   . Hypertension Neg Hx   . Cancer Neg Hx   . Stroke Neg Hx     Social History: Social History   Social History  . Marital status: Single    Spouse name: N/A  . Number of children: N/A  . Years of education: N/A   Occupational History  . Not on file.   Social History Main Topics  . Smoking status: Current Every Day Smoker    Packs/day: 0.50    Years: 45.00    Types: Cigarettes    Start date: 02/06/1971  . Smokeless tobacco: Never Used     Comment: 8-9 cigarettes/day  . Alcohol use Yes     Comment: OCCASIONAL  . Drug use: No  . Sexual activity: Not on file   Other Topics Concern  . Not on file   Social History Narrative   [facility] (08/15/16):   Originally from [facility]. Previously worked in [employer]. Patient has lived in [place]. No pets currently. No mold or bird exposure.     Allergies: Allergies  Allergen Reactions  . Contrast Media [Iodinated Diagnostic Agents] Swelling    Eye swelling    Medications:  Current Outpatient Prescriptions  Medication Sig Dispense Refill  . albuterol (PROVENTIL HFA;VENTOLIN HFA) 108 (90 Base) MCG/ACT inhaler Inhale 2 puffs into the lungs every 4 (four) hours as needed for wheezing or shortness of breath. 1 Inhaler 2  . ALPRAZolam (XANAX) 1 MG tablet Take 1 mg by mouth 3 (three) times daily as needed for anxiety.  5  . CHANTIX 1 MG tablet Take 1 mg by mouth 2 (two) times daily.  3  . FLUoxetine (PROZAC) 10 MG capsule Take 10 mg by mouth every morning.  0  . fluticasone furoate-vilanterol (BREO ELLIPTA) 200-25 MCG/INH AEPB Inhale 1 puff into the lungs daily. 1 each 6  .  Multiple Vitamin (MULTIVITAMIN) tablet Take 1 tablet by mouth daily.    Marland Kitchen tiotropium (SPIRIVA HANDIHALER) 18 MCG inhalation capsule Place 1 capsule (18 mcg total) into inhaler and inhale daily. (Patient not taking: Reported on 09/28/2016) 30 capsule 0  . zolpidem (AMBIEN) 10 MG tablet Take 10 mg by mouth at bedtime.     No current facility-administered medications for this visit.     Review of Systems: Review of Systems  All other systems reviewed and are negative.    PHYSICAL EXAMINATION Blood pressure 123/82, pulse 85, temperature 98 F (36.7 C), temperature source Oral, resp. rate 18, height _0  (1.702 m), weight 193 lb 3.2 oz (87.6 kg), SpO2 99 %.  ECOG PERFORMANCE STATUS: 0 - Asymptomatic  Physical Exam  Constitutional: He is oriented to person, place, and time and well-developed,  well-nourished, and in no distress. No distress.  HENT:  Head: Normocephalic and atraumatic.  Mouth/Throat: Oropharynx is clear and moist. No oropharyngeal exudate.  Eyes: Pupils are equal, round, and reactive to light. EOM are normal. No scleral icterus.  Neck: Normal range of motion. No JVD present. No thyromegaly present.  Cardiovascular: Normal rate and regular rhythm.  Exam reveals no friction rub.   No murmur heard. Pulmonary/Chest: Effort normal and breath sounds normal. He has no wheezes. He has no rales.  Abdominal: Soft. He exhibits no distension and no mass. There is no tenderness. There is no guarding.  Musculoskeletal: Normal range of motion. He exhibits no edema.  Lymphadenopathy:    He has no cervical adenopathy.  Neurological: He is alert and oriented to person, place, and time. He has normal reflexes. No cranial nerve deficit.  Skin: Skin is warm and dry. He is not diaphoretic. No erythema. No pallor.     LABORATORY DATA: I have personally reviewed the data as listed: Appointment on 02/10/2017  Component Date Value Ref Range Status  . WBC 02/10/2017 10.7* 4.0 - 10.3 10e3/uL  Final  . NEUT# 02/10/2017 7.3* 1.5 - 6.5 10e3/uL Final  . HGB 02/10/2017 19.2* 13.0 - 17.1 g/dL Final  . HCT 02/10/2017 54.4* 38.4 - 49.9 % Final  . Platelets 02/10/2017 168  140 - 400 10e3/uL Final  . MCV 02/10/2017 91.3  79.3 - 98.0 fL Final  . MCH 02/10/2017 32.2  27.2 - 33.4 pg Final  . MCHC 02/10/2017 35.3  32.0 - 36.0 g/dL Final  . RBC 02/10/2017 5.96* 4.20 - 5.82 10e6/uL Final  . RDW 02/10/2017 14.3  11.0 - 14.6 % Final  . lymph# 02/10/2017 2.3  0.9 - 3.3 10e3/uL Final  . MONO# 02/10/2017 1.0* 0.1 - 0.9 10e3/uL Final  . Eosinophils Absolute 02/10/2017 0.1  0.0 - 0.5 10e3/uL Final  . Basophils Absolute 02/10/2017 0.0  0.0 - 0.1 10e3/uL Final  . NEUT% 02/10/2017 68.4  39.0 - 75.0 % Final  . LYMPH% 02/10/2017 21.4  14.0 - 49.0 % Final  . MONO% 02/10/2017 9.4  0.0 - 14.0 % Final  . EOS% 02/10/2017 0.7  0.0 - 7.0 % Final  . BASO% 02/10/2017 0.1  0.0 - 2.0 % Final  . nRBC 02/10/2017 0  0 - 0 % Final  . Retic % 02/10/2017 1.60  0.80 - 1.80 % Final  . Retic Ct Abs 02/10/2017 95.36* 34.80 - 93.90 10e3/uL Final  . Immature Retic Fract 02/10/2017 2.50* 3.00 - 10.60 % Final  . LDH 02/10/2017 181  125 - 245 U/L Final  . Sodium 02/10/2017 138  136 - 145 mEq/L Final  . Potassium 02/10/2017 4.1  3.5 - 5.1 mEq/L Final  . Chloride 02/10/2017 105  98 - 109 mEq/L Final  . CO2 02/10/2017 23  22 - 29 mEq/L Final  . Glucose 02/10/2017 104  70 - 140 mg/dl Final   Glucose reference range is for nonfasting patients. Fasting glucose reference range is 70- 100.  Marland Kitchen BUN 02/10/2017 14.3  7.0 - 26.0 mg/dL Final  . Creatinine 02/10/2017 1.2  0.7 - 1.3 mg/dL Final  . Total Bilirubin 02/10/2017 0.72  0.20 - 1.20 mg/dL Final  . Alkaline Phosphatase 02/10/2017 96  40 - 150 U/L Final  . AST 02/10/2017 19  5 - 34 U/L Final  . ALT 02/10/2017 27  0 - 55 U/L Final  . Total Protein 02/10/2017 6.8  6.4 - 8.3 g/dL Final  . Albumin  02/10/2017 3.8  3.5 - 5.0 g/dL Final  . Calcium 02/10/2017 9.6  8.4 - 10.4 mg/dL Final   . Anion Gap 02/10/2017 9  3 - 11 mEq/L Final  . EGFR 02/10/2017 66* >90 ml/min/1.73 m2 Final   eGFR is calculated using the CKD-EPI Creatinine Equation (2009)  . Erythropoietin 02/10/2017 25.8* 2.6 - 18.5 mIU/mL Final   Beck[employer] Coul[employer] UniCel DxI 800 Immunoassay System       Ardath Sax, MD

## 2017-03-10 ENCOUNTER — Telehealth: Payer: Self-pay | Admitting: Hematology and Oncology

## 2017-03-10 NOTE — Telephone Encounter (Signed)
Called patient regarding 9/7 appointment

## 2017-03-15 ENCOUNTER — Ambulatory Visit: Payer: BLUE CROSS/BLUE SHIELD | Admitting: Hematology and Oncology

## 2017-03-17 ENCOUNTER — Encounter (INDEPENDENT_AMBULATORY_CARE_PROVIDER_SITE_OTHER): Payer: Self-pay

## 2017-03-17 ENCOUNTER — Ambulatory Visit (HOSPITAL_BASED_OUTPATIENT_CLINIC_OR_DEPARTMENT_OTHER): Payer: BLUE CROSS/BLUE SHIELD | Admitting: Hematology and Oncology

## 2017-03-17 ENCOUNTER — Telehealth: Payer: Self-pay | Admitting: Hematology and Oncology

## 2017-03-17 VITALS — BP 126/70 | HR 78 | Temp 98.4°F | Resp 20 | Ht 67.0 in | Wt 195.6 lb

## 2017-03-17 DIAGNOSIS — J449 Chronic obstructive pulmonary disease, unspecified: Secondary | ICD-10-CM | POA: Diagnosis not present

## 2017-03-17 DIAGNOSIS — F17200 Nicotine dependence, unspecified, uncomplicated: Secondary | ICD-10-CM | POA: Diagnosis not present

## 2017-03-17 DIAGNOSIS — D751 Secondary polycythemia: Secondary | ICD-10-CM

## 2017-03-17 NOTE — Telephone Encounter (Signed)
Gave patient avs and calendar with appts.  °

## 2017-03-21 NOTE — Assessment & Plan Note (Signed)
61 year old male with extensive smoking history and recent diagnosis of COPD refer to the clinic for elevated hemoglobin value and leukocytosis. Upon our recent visit, we'll have him obtain additional lab work which demonstrated persistent elevated hemoglobin and leukocytosis. Platelets are normal. We have demonstrated elevated level of erythropoietin. In this setting, we are unlikely to be dealing with a primary erythrocytosis or primary myeloproliferative neoplasm as those usually are accompanied by suppression of the erythropoietin production. 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  Elevated erythropoietin also may accompany some malignancies including renal cell carcinoma and hepatocellular carcinoma. We will evaluate for those with imaging.   Plan: --MRI abdomen to r/o HCC and RCC --RTC 1 wk after the imaging  Voice recognition software was used and creation of this note. Despite my best effort at editing the text, some misspelling/errors may have occurred.

## 2017-03-21 NOTE — Progress Notes (Signed)
[facility] Follow-up Visit:  Assessment: Erythrocytosis 61 year old male with extensive smoking history and recent diagnosis of COPD refer to the clinic for elevated hemoglobin value and leukocytosis. Upon our recent visit, we'll have him obtain additional lab work which demonstrated persistent elevated hemoglobin and leukocytosis. Platelets are normal. We have demonstrated elevated level of erythropoietin. In this setting, we are unlikely to be dealing with a primary erythrocytosis or primary myeloproliferative neoplasm as those usually are accompanied by suppression of the erythropoietin production. 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  Elevated erythropoietin also may accompany some malignancies including renal cell carcinoma and hepatocellular carcinoma. We will evaluate for those with imaging.   Plan: --MRI abdomen to r/o HCC and RCC --RTC 1 wk after the imaging  Voice recognition software was used and creation of this note. Despite my best effort at editing the text, some misspelling/errors may have occurred.   Orders Placed This Encounter  Procedures  . CBC with Differential    Standing Status:   Future    Standing Expiration Date:   03/17/2018   All questions were answered.  . The patient knows to call the clinic with any problems, questions or concerns.  This note was electronically signed.    History of Presenting Illness Mitchell Dean is a 61 y.o. male followed in the [facility] for evaluation of elevated white blood cell count and hemoglobin. Patient is a long-term smoker, continues currently to smoke, but is trying to reduce the number of cigarettes per day. He was recently diagnosed with COPD and is currently undergoing management by pulmonology. Patient denies any significant changes to his health other than the respiratory symptoms. Denies any fevers, chills, night sweats. No decrease  in appetite, no facial flushing, pain or itching or burning in the extremities. He denies any nausea, vomiting, early satiety, diarrhea, or constipation.  Patient returns to the clinic to continue evaluation. He was supposed to obtain MRI of the abdomen is for possible presence of hepatic or renal masses, nevertheless he did not get the imaging done so far. The interim, he denies any new symptoms.  Oncological/hematological History: --Labs, 08/03/16: WBC 16.0, Hgb 19.5, MCV 89.9, MCH 32.2, RDW 13.9, Plt 177; --Labs, 08/11/16: WBC 9.5, Hgb 18.4, MCV 91.0, MCH 30.9, RDW 14.0, Plt 194;  --Labs, 09/09/16: WBC 8.8, Hgb 17.9, MCV 91.8, MCH 31.7, RDW 14.3, Plt 177; --Labs, 12/22/16: WBC 8.7, Hgb 18.1, MCV 91.8, MCH 31.6, RDW 14.6, Plt 186;  --Labs, 02/10/17: WBC 10.7, Hgb 19.2, MCV 91.3, MCH 32.2, RDW 14.3, Plt 168; Epo 25.8(up)    Medical History: Past Medical History:  Diagnosis Date  . Arthritis    hands  . Constipation   . COPD (chronic obstructive pulmonary disease) ([place])   . Depression   . Mild acid reflux    Minimal  . Prolapsed internal hemorrhoids     Surgical History: Past Surgical History:  Procedure Laterality Date  . APPENDECTOMY  2001  . HEMORRHOID SURGERY N/A 04/02/2014   Procedure: EXCISION OF INTERNAL HEMORRHOIDS;  Surgeon: Leighton Ruff, MD;  Location: [facility];  Service: General;  Laterality: N/A;  . I & D  RIGHT LEG INFECTION, EXTENSIVE  AGE 46  . LEG SURGERY Right     Family History: Family History  Problem Relation Age of Onset  . Emphysema Father   . Hypertension Neg Hx   . Cancer Neg Hx   . Stroke  Neg Hx     Social History: Social History   Social History  . Marital status: Single    Spouse name: N/A  . Number of children: N/A  . Years of education: N/A   Occupational History  . Not on file.   Social History Main Topics  . Smoking status: Current Every Day Smoker    Packs/day: 0.50    Years: 45.00    Types: Cigarettes     Start date: 02/06/1971  . Smokeless tobacco: Never Used     Comment: 8-9 cigarettes/day  . Alcohol use Yes     Comment: OCCASIONAL  . Drug use: No  . Sexual activity: Not on file   Other Topics Concern  . Not on file   Social History Narrative   [facility] (08/15/16):   Originally from [facility]. Previously worked in [employer]. Patient has lived in [place]. No pets currently. No mold or bird exposure.     Allergies: Allergies  Allergen Reactions  . Contrast Media [Iodinated Diagnostic Agents] Swelling    Eye swelling    Medications:  Current Outpatient Prescriptions  Medication Sig Dispense Refill  . albuterol (PROVENTIL HFA;VENTOLIN HFA) 108 (90 Base) MCG/ACT inhaler Inhale 2 puffs into the lungs every 4 (four) hours as needed for wheezing or shortness of breath. 1 Inhaler 2  . ALPRAZolam (XANAX) 1 MG tablet Take 1 mg by mouth 3 (three) times daily as needed for anxiety.  5  . CHANTIX 1 MG tablet Take 1 mg by mouth 2 (two) times daily.  3  . FLUoxetine (PROZAC) 10 MG capsule Take 10 mg by mouth every morning.  0  . fluticasone furoate-vilanterol (BREO ELLIPTA) 200-25 MCG/INH AEPB Inhale 1 puff into the lungs daily. 1 each 6  . Multiple Vitamin (MULTIVITAMIN) tablet Take 1 tablet by mouth daily.    Marland Kitchen tiotropium (SPIRIVA HANDIHALER) 18 MCG inhalation capsule Place 1 capsule (18 mcg total) into inhaler and inhale daily. (Patient not taking: Reported on 09/28/2016) 30 capsule 0  . zolpidem (AMBIEN) 10 MG tablet Take 10 mg by mouth at bedtime.     No current facility-administered medications for this visit.     Review of Systems: Review of Systems  All other systems reviewed and are negative.    PHYSICAL EXAMINATION Blood pressure 126/70, pulse 78, temperature 98.4 F (36.9 C), temperature source Oral, resp. rate 20, height '5\' 7"'  (1.702 m), weight 195 lb 9.6 oz (88.7 kg), SpO2 98 %.  ECOG PERFORMANCE STATUS: 0 - Asymptomatic  Physical Exam  Constitutional: He  is oriented to person, place, and time and well-developed, well-nourished, and in no distress. No distress.  HENT:  Head: Normocephalic and atraumatic.  Mouth/Throat: Oropharynx is clear and moist. No oropharyngeal exudate.  Eyes: Pupils are equal, round, and reactive to light. EOM are normal. No scleral icterus.  Neck: Normal range of motion. No JVD present. No thyromegaly present.  Cardiovascular: Normal rate and regular rhythm.  Exam reveals no friction rub.   No murmur heard. Pulmonary/Chest: Effort normal and breath sounds normal. He has no wheezes. He has no rales.  Abdominal: Soft. He exhibits no distension and no mass. There is no tenderness. There is no guarding.  Musculoskeletal: Normal range of motion. He exhibits no edema.  Lymphadenopathy:    He has no cervical adenopathy.  Neurological: He is alert and oriented to person, place, and time. He has normal reflexes. No cranial nerve deficit.  Skin: Skin is warm and dry.  He is not diaphoretic. No erythema. No pallor.     LABORATORY DATA: I have personally reviewed the data as listed: No visits with results within 1 Week(s) from this visit.  Latest known visit with results is:  Appointment on 02/10/2017  Component Date Value Ref Range Status  . WBC 02/10/2017 10.7* 4.0 - 10.3 10e3/uL Final  . NEUT# 02/10/2017 7.3* 1.5 - 6.5 10e3/uL Final  . HGB 02/10/2017 19.2* 13.0 - 17.1 g/dL Final  . HCT 02/10/2017 54.4* 38.4 - 49.9 % Final  . Platelets 02/10/2017 168  140 - 400 10e3/uL Final  . MCV 02/10/2017 91.3  79.3 - 98.0 fL Final  . MCH 02/10/2017 32.2  27.2 - 33.4 pg Final  . MCHC 02/10/2017 35.3  32.0 - 36.0 g/dL Final  . RBC 02/10/2017 5.96* 4.20 - 5.82 10e6/uL Final  . RDW 02/10/2017 14.3  11.0 - 14.6 % Final  . lymph# 02/10/2017 2.3  0.9 - 3.3 10e3/uL Final  . MONO# 02/10/2017 1.0* 0.1 - 0.9 10e3/uL Final  . Eosinophils Absolute 02/10/2017 0.1  0.0 - 0.5 10e3/uL Final  . Basophils Absolute 02/10/2017 0.0  0.0 - 0.1 10e3/uL  Final  . NEUT% 02/10/2017 68.4  39.0 - 75.0 % Final  . LYMPH% 02/10/2017 21.4  14.0 - 49.0 % Final  . MONO% 02/10/2017 9.4  0.0 - 14.0 % Final  . EOS% 02/10/2017 0.7  0.0 - 7.0 % Final  . BASO% 02/10/2017 0.1  0.0 - 2.0 % Final  . nRBC 02/10/2017 0  0 - 0 % Final  . Retic % 02/10/2017 1.60  0.80 - 1.80 % Final  . Retic Ct Abs 02/10/2017 95.36* 34.80 - 93.90 10e3/uL Final  . Immature Retic Fract 02/10/2017 2.50* 3.00 - 10.60 % Final  . LDH 02/10/2017 181  125 - 245 U/L Final  . Sodium 02/10/2017 138  136 - 145 mEq/L Final  . Potassium 02/10/2017 4.1  3.5 - 5.1 mEq/L Final  . Chloride 02/10/2017 105  98 - 109 mEq/L Final  . CO2 02/10/2017 23  22 - 29 mEq/L Final  . Glucose 02/10/2017 104  70 - 140 mg/dl Final   Glucose reference range is for nonfasting patients. Fasting glucose reference range is 70- 100.  Marland Kitchen BUN 02/10/2017 14.3  7.0 - 26.0 mg/dL Final  . Creatinine 02/10/2017 1.2  0.7 - 1.3 mg/dL Final  . Total Bilirubin 02/10/2017 0.72  0.20 - 1.20 mg/dL Final  . Alkaline Phosphatase 02/10/2017 96  40 - 150 U/L Final  . AST 02/10/2017 19  5 - 34 U/L Final  . ALT 02/10/2017 27  0 - 55 U/L Final  . Total Protein 02/10/2017 6.8  6.4 - 8.3 g/dL Final  . Albumin 02/10/2017 3.8  3.5 - 5.0 g/dL Final  . Calcium 02/10/2017 9.6  8.4 - 10.4 mg/dL Final  . Anion Gap 02/10/2017 9  3 - 11 mEq/L Final  . EGFR 02/10/2017 66* >90 ml/min/1.73 m2 Final   eGFR is calculated using the CKD-EPI Creatinine Equation (2009)  . Erythropoietin 02/10/2017 25.8* 2.6 - 18.5 mIU/mL Final   Beck[employer] Coulter UniCel DxI 800 Immunoassay System       Ardath Sax, MD

## 2017-03-26 ENCOUNTER — Other Ambulatory Visit: Payer: BLUE CROSS/BLUE SHIELD

## 2017-03-31 ENCOUNTER — Ambulatory Visit: Payer: BLUE CROSS/BLUE SHIELD | Admitting: Hematology and Oncology

## 2017-03-31 ENCOUNTER — Other Ambulatory Visit: Payer: BLUE CROSS/BLUE SHIELD

## 2017-04-09 ENCOUNTER — Ambulatory Visit
Admission: RE | Admit: 2017-04-09 | Discharge: 2017-04-09 | Disposition: A | Discharge status: Home / Self Care | Payer: BLUE CROSS/BLUE SHIELD | Source: Ambulatory Visit | Attending: Hematology and Oncology | Admitting: Hematology and Oncology

## 2017-04-09 DIAGNOSIS — K8020 Calculus of gallbladder without cholecystitis without obstruction: Secondary | ICD-10-CM | POA: Diagnosis not present

## 2017-04-09 DIAGNOSIS — N281 Cyst of kidney, acquired: Secondary | ICD-10-CM | POA: Diagnosis not present

## 2017-04-09 DIAGNOSIS — D751 Secondary polycythemia: Secondary | ICD-10-CM

## 2017-04-09 MED ORDER — GADOBENATE DIMEGLUMINE 529 MG/ML IV SOLN
18.0000 mL | Freq: Once | INTRAVENOUS | Status: AC | PRN
  Administered 2017-04-09: 18 mL via INTRAVENOUS

## 2017-04-14 ENCOUNTER — Other Ambulatory Visit: Payer: Self-pay

## 2017-04-17 ENCOUNTER — Other Ambulatory Visit (HOSPITAL_BASED_OUTPATIENT_CLINIC_OR_DEPARTMENT_OTHER): Payer: BLUE CROSS/BLUE SHIELD

## 2017-04-17 ENCOUNTER — Encounter: Payer: Self-pay | Admitting: Hematology and Oncology

## 2017-04-17 ENCOUNTER — Telehealth: Payer: Self-pay | Admitting: Hematology and Oncology

## 2017-04-17 ENCOUNTER — Ambulatory Visit (HOSPITAL_BASED_OUTPATIENT_CLINIC_OR_DEPARTMENT_OTHER): Payer: BLUE CROSS/BLUE SHIELD | Admitting: Hematology and Oncology

## 2017-04-17 VITALS — BP 126/96 | HR 89 | Temp 98.4°F | Resp 18 | Ht 67.0 in | Wt 196.0 lb

## 2017-04-17 DIAGNOSIS — D72829 Elevated white blood cell count, unspecified: Secondary | ICD-10-CM

## 2017-04-17 DIAGNOSIS — D751 Secondary polycythemia: Secondary | ICD-10-CM | POA: Diagnosis not present

## 2017-04-17 DIAGNOSIS — J449 Chronic obstructive pulmonary disease, unspecified: Secondary | ICD-10-CM | POA: Diagnosis not present

## 2017-04-17 DIAGNOSIS — F17200 Nicotine dependence, unspecified, uncomplicated: Secondary | ICD-10-CM

## 2017-04-17 LAB — CBC WITH DIFFERENTIAL/PLATELET
BASO%: 0.2 % (ref 0.0–2.0)
BASOS ABS: 0 10*3/uL (ref 0.0–0.1)
EOS%: 1.4 % (ref 0.0–7.0)
Eosinophils Absolute: 0.1 10*3/uL (ref 0.0–0.5)
HEMATOCRIT: 48.6 % (ref 38.4–49.9)
HEMOGLOBIN: 16.8 g/dL (ref 13.0–17.1)
LYMPH#: 2.4 10*3/uL (ref 0.9–3.3)
LYMPH%: 27.3 % (ref 14.0–49.0)
MCH: 31.9 pg (ref 27.2–33.4)
MCHC: 34.6 g/dL (ref 32.0–36.0)
MCV: 92.2 fL (ref 79.3–98.0)
MONO#: 0.8 10*3/uL (ref 0.1–0.9)
MONO%: 9.1 % (ref 0.0–14.0)
NEUT%: 62 % (ref 39.0–75.0)
NEUTROS ABS: 5.4 10*3/uL (ref 1.5–6.5)
Platelets: 186 10*3/uL (ref 140–400)
RBC: 5.27 10*6/uL (ref 4.20–5.82)
RDW: 14.1 % (ref 11.0–14.6)
WBC: 8.8 10*3/uL (ref 4.0–10.3)

## 2017-04-17 NOTE — Telephone Encounter (Signed)
Scheduled appt per 10/8 los - Gave patient AVS and calender per los. Lab and f/u in 6 months.

## 2017-04-19 NOTE — Assessment & Plan Note (Signed)
61 y.o. male with extensive smoking history and recent diagnosis of COPD refer to the clinic for elevated hemoglobin value and leukocytosis. Upon our recent visit, we'll have him obtain additional lab work which demonstrated persistent elevated hemoglobin and leukocytosis. Platelets are normal. We have demonstrated elevated level of erythropoietin. In this setting, we are unlikely to be dealing with a primary erythrocytosis or primary myeloproliferative neoplasm as those usually are accompanied by suppression of the erythropoietin production. 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 Imaging of the abdomen demonstrated no evidence of hepatic or renal masses to suspected hepatocellular carcinoma or renal cell carcinoma respectively as the source of erythropoietin. Interim, patient has significantly improved the compliance with respiratory medications. His hemoglobin level appears to have normalized at this time. He is also cutting down significantly on the amount of cigarettes he smokes per day.    Plan: --Continue observation --Return to clinic in 6 months with lab work  Armed forces training and education officer was used and Agricultural engineer of this note. Despite my best effort at editing the text, some misspelling/errors may have occurred.

## 2017-04-19 NOTE — Progress Notes (Signed)
[facility] Follow-up Visit:  Assessment: Erythrocytosis 61 y.o. male with extensive smoking history and recent diagnosis of COPD refer to the clinic for elevated hemoglobin value and leukocytosis. Upon our recent visit, we'll have him obtain additional lab work which demonstrated persistent elevated hemoglobin and leukocytosis. Platelets are normal. We have demonstrated elevated level of erythropoietin. In this setting, we are unlikely to be dealing with a primary erythrocytosis or primary myeloproliferative neoplasm as those usually are accompanied by suppression of the erythropoietin production. 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 Imaging of the abdomen demonstrated no evidence of hepatic or renal masses to suspected hepatocellular carcinoma or renal cell carcinoma respectively as the source of erythropoietin. Interim, patient has significantly improved the compliance with respiratory medications. His hemoglobin level appears to have normalized at this time. He is also cutting down significantly on the amount of cigarettes he smokes per day.    Plan: --Continue observation --Return to clinic in 6 months with lab work  Armed forces training and education officer was used and Agricultural engineer of this note. Despite my best effort at editing the text, some misspelling/errors may have occurred.   Orders Placed This Encounter  Procedures  . CBC with Differential    Standing Status:   Future    Standing Expiration Date:   04/17/2018  . Comprehensive metabolic panel    Standing Status:   Future    Standing Expiration Date:   04/17/2018   All questions were answered.  . The patient knows to call the clinic with any problems, questions or concerns.  This note was electronically signed.    History of Presenting Illness LANSON RANDLE is a 61 y.o. male followed in the [facility] for evaluation of elevated white blood cell count and  hemoglobin. Patient is a long-term smoker, continues currently to smoke, but is trying to reduce the number of cigarettes per day. He was recently diagnosed with COPD and is currently undergoing management by pulmonology. Patient denies any significant changes to his health other than the respiratory symptoms. Denies any fevers, chills, night sweats. No decrease in appetite, no facial flushing, pain or itching or burning in the extremities. He denies any nausea, vomiting, early satiety, diarrhea, or constipation.  Patient returns to the clinic to continue evaluation. Patient has obtained MRI of the abdomen results of which are briefly reviewed below. He is currently continuing to use his experimental respiratory medication and does not use any other scheduled respiratory drugs.  Oncological/hematological History: --Labs, 08/03/16: WBC 16.0, Hgb 19.5, MCV 89.9, MCH 32.2, RDW 13.9, Plt 177; --Labs, 08/11/16: WBC   9.5, Hgb 18.4, MCV 91.0, MCH 30.9, RDW 14.0, Plt 194;  --Labs, 09/09/16: WBC   8.8, Hgb 17.9, MCV 91.8, MCH 31.7, RDW 14.3, Plt 177; --Labs, 12/22/16: WBC   8.7, Hgb 18.1, MCV 91.8, MCH 31.6, RDW 14.6, Plt 186; --Labs, 02/10/17: WBC 10.7, Hgb 19.2, MCV 91.3, MCH 32.2, RDW 14.3, Plt 168; Epo 25.8(up) --MRI Abdomen, 04/09/17: No evidence of abnormalities or mass lesions in the liver or kidneys. --Labs, 04/17/17: WBC   8.8, Hgb 16.8, MCV 92.2, MCH 31.9, RDW 14.1, Plt 186;     Medical History: Past Medical History:  Diagnosis Date  . Arthritis    hands  . Constipation   . COPD (chronic obstructive pulmonary disease) ([place])   . Depression   . Mild acid reflux    Minimal  . Prolapsed internal hemorrhoids  Surgical History: Past Surgical History:  Procedure Laterality Date  . APPENDECTOMY  2001  . HEMORRHOID SURGERY N/A 04/02/2014   Procedure: EXCISION OF INTERNAL HEMORRHOIDS;  Surgeon: Leighton Ruff, MD;  Location: [facility];  Service: General;  Laterality: N/A;   . I & D  RIGHT LEG INFECTION, EXTENSIVE  AGE 67  . LEG SURGERY Right     Family History: Family History  Problem Relation Age of Onset  . Emphysema Father   . Hypertension Neg Hx   . Cancer Neg Hx   . Stroke Neg Hx     Social History: Social History   Social History  . Marital status: Single    Spouse name: N/A  . Number of children: N/A  . Years of education: N/A   Occupational History  . Not on file.   Social History Main Topics  . Smoking status: Current Every Day Smoker    Packs/day: 0.50    Years: 45.00    Types: Cigarettes    Start date: 02/06/1971  . Smokeless tobacco: Never Used     Comment: 8-9 cigarettes/day  . Alcohol use 0.6 oz/week    1 Cans of beer per week     Comment: Weekly  . Drug use: No  . Sexual activity: Not on file   Other Topics Concern  . Not on file   Social History Narrative   [facility] (08/15/16):   Originally from [facility]. Previously worked in [employer]. Patient has lived in [place]. No pets currently. No mold or bird exposure.     Allergies: Allergies  Allergen Reactions  . Contrast Media [Iodinated Diagnostic Agents] Swelling    Eye swelling    Medications:  Current Outpatient Prescriptions  Medication Sig Dispense Refill  . ALPRAZolam (XANAX) 1 MG tablet Take 1 mg by mouth 3 (three) times daily as needed for anxiety.  5  . CHANTIX 1 MG tablet Take 1 mg by mouth 2 (two) times daily.  3  . FLUoxetine (PROZAC) 10 MG capsule Take 10 mg by mouth every morning.  0  . Multiple Vitamin (MULTIVITAMIN) tablet Take 1 tablet by mouth daily.    Marland Kitchen zolpidem (AMBIEN) 10 MG tablet Take 10 mg by mouth at bedtime.    Marland Kitchen albuterol (PROVENTIL HFA;VENTOLIN HFA) 108 (90 Base) MCG/ACT inhaler Inhale 2 puffs into the lungs every 4 (four) hours as needed for wheezing or shortness of breath. (Patient not taking: Reported on 04/17/2017) 1 Inhaler 2   No current facility-administered medications for this visit.     Review of  Systems: Review of Systems  All other systems reviewed and are negative.    PHYSICAL EXAMINATION Blood pressure (!) 126/96, pulse 89, temperature 98.4 F (36.9 C), temperature source Oral, resp. rate 18, height 5\' 7"  (1.702 m), weight 196 lb (88.9 kg), SpO2 98 %.  ECOG PERFORMANCE STATUS: 0 - Asymptomatic  Physical Exam  Constitutional: He is oriented to person, place, and time and well-developed, well-nourished, and in no distress. No distress.  HENT:  Head: Normocephalic and atraumatic.  Mouth/Throat: Oropharynx is clear and moist. No oropharyngeal exudate.  Eyes: Pupils are equal, round, and reactive to light. EOM are normal. No scleral icterus.  Neck: Normal range of motion. No JVD present. No thyromegaly present.  Cardiovascular: Normal rate and regular rhythm.  Exam reveals no friction rub.   No murmur heard. Pulmonary/Chest: Effort normal and breath sounds normal. He has no wheezes. He has no rales.  Abdominal:  Soft. He exhibits no distension and no mass. There is no tenderness. There is no guarding.  Musculoskeletal: Normal range of motion. He exhibits no edema.  Lymphadenopathy:    He has no cervical adenopathy.  Neurological: He is alert and oriented to person, place, and time. He has normal reflexes. No cranial nerve deficit.  Skin: Skin is warm and dry. He is not diaphoretic. No erythema. No pallor.     LABORATORY DATA: I have personally reviewed the data as listed: Appointment on 04/17/2017  Component Date Value Ref Range Status  . WBC 04/17/2017 8.8  4.0 - 10.3 10e3/uL Final  . NEUT# 04/17/2017 5.4  1.5 - 6.5 10e3/uL Final  . HGB 04/17/2017 16.8  13.0 - 17.1 g/dL Final  . HCT 04/17/2017 48.6  38.4 - 49.9 % Final  . Platelets 04/17/2017 186  140 - 400 10e3/uL Final  . MCV 04/17/2017 92.2  79.3 - 98.0 fL Final  . MCH 04/17/2017 31.9  27.2 - 33.4 pg Final  . MCHC 04/17/2017 34.6  32.0 - 36.0 g/dL Final  . RBC 04/17/2017 5.27  4.20 - 5.82 10e6/uL Final  . RDW  04/17/2017 14.1  11.0 - 14.6 % Final  . lymph# 04/17/2017 2.4  0.9 - 3.3 10e3/uL Final  . MONO# 04/17/2017 0.8  0.1 - 0.9 10e3/uL Final  . Eosinophils Absolute 04/17/2017 0.1  0.0 - 0.5 10e3/uL Final  . Basophils Absolute 04/17/2017 0.0  0.0 - 0.1 10e3/uL Final  . NEUT% 04/17/2017 62.0  39.0 - 75.0 % Final  . LYMPH% 04/17/2017 27.3  14.0 - 49.0 % Final  . MONO% 04/17/2017 9.1  0.0 - 14.0 % Final  . EOS% 04/17/2017 1.4  0.0 - 7.0 % Final  . BASO% 04/17/2017 0.2  0.0 - 2.0 % Final       Ardath Sax, MD

## 2017-06-13 DIAGNOSIS — H6522 Chronic serous otitis media, left ear: Secondary | ICD-10-CM | POA: Diagnosis not present

## 2017-06-22 DIAGNOSIS — J301 Allergic rhinitis due to pollen: Secondary | ICD-10-CM | POA: Diagnosis not present

## 2017-06-22 DIAGNOSIS — H6523 Chronic serous otitis media, bilateral: Secondary | ICD-10-CM | POA: Diagnosis not present

## 2017-06-27 DIAGNOSIS — G4700 Insomnia, unspecified: Secondary | ICD-10-CM | POA: Diagnosis not present

## 2017-06-27 DIAGNOSIS — F329 Major depressive disorder, single episode, unspecified: Secondary | ICD-10-CM | POA: Diagnosis not present

## 2017-06-27 DIAGNOSIS — F419 Anxiety disorder, unspecified: Secondary | ICD-10-CM | POA: Diagnosis not present

## 2017-06-27 DIAGNOSIS — F17200 Nicotine dependence, unspecified, uncomplicated: Secondary | ICD-10-CM | POA: Diagnosis not present

## 2017-07-03 DIAGNOSIS — J189 Pneumonia, unspecified organism: Secondary | ICD-10-CM | POA: Diagnosis not present

## 2017-08-02 DIAGNOSIS — Z23 Encounter for immunization: Secondary | ICD-10-CM | POA: Diagnosis not present

## 2017-10-10 DIAGNOSIS — M4696 Unspecified inflammatory spondylopathy, lumbar region: Secondary | ICD-10-CM | POA: Diagnosis not present

## 2017-10-10 DIAGNOSIS — M545 Low back pain: Secondary | ICD-10-CM | POA: Diagnosis not present

## 2017-10-16 ENCOUNTER — Inpatient Hospital Stay: Payer: BLUE CROSS/BLUE SHIELD

## 2017-10-16 ENCOUNTER — Telehealth: Payer: Self-pay | Admitting: Hematology and Oncology

## 2017-10-16 ENCOUNTER — Inpatient Hospital Stay: Payer: BLUE CROSS/BLUE SHIELD | Attending: Hematology and Oncology | Admitting: Hematology and Oncology

## 2017-10-16 VITALS — BP 142/75 | HR 82 | Temp 98.1°F | Resp 18 | Ht 67.0 in | Wt 197.7 lb

## 2017-10-16 DIAGNOSIS — D751 Secondary polycythemia: Secondary | ICD-10-CM

## 2017-10-16 DIAGNOSIS — J449 Chronic obstructive pulmonary disease, unspecified: Secondary | ICD-10-CM | POA: Diagnosis not present

## 2017-10-16 DIAGNOSIS — F17210 Nicotine dependence, cigarettes, uncomplicated: Secondary | ICD-10-CM | POA: Diagnosis not present

## 2017-10-16 LAB — CBC WITH DIFFERENTIAL/PLATELET
BASOS PCT: 0 %
Basophils Absolute: 0 10*3/uL (ref 0.0–0.1)
Eosinophils Absolute: 0 10*3/uL (ref 0.0–0.5)
Eosinophils Relative: 0 %
HEMATOCRIT: 48.6 % (ref 38.4–49.9)
HEMOGLOBIN: 16.8 g/dL (ref 13.0–17.1)
Lymphocytes Relative: 18 %
Lymphs Abs: 2.5 10*3/uL (ref 0.9–3.3)
MCH: 31.2 pg (ref 27.2–33.4)
MCHC: 34.6 g/dL (ref 32.0–36.0)
MCV: 90.2 fL (ref 79.3–98.0)
MONOS PCT: 6 %
Monocytes Absolute: 0.9 10*3/uL (ref 0.1–0.9)
NEUTROS ABS: 10.6 10*3/uL — AB (ref 1.5–6.5)
NEUTROS PCT: 76 %
Platelets: 190 10*3/uL (ref 140–400)
RBC: 5.39 MIL/uL (ref 4.20–5.82)
RDW: 15.5 % — ABNORMAL HIGH (ref 11.0–14.6)
WBC: 14 10*3/uL — ABNORMAL HIGH (ref 4.0–10.3)

## 2017-10-16 LAB — COMPREHENSIVE METABOLIC PANEL
ALBUMIN: 3.7 g/dL (ref 3.5–5.0)
ALT: 29 U/L (ref 0–55)
AST: 13 U/L (ref 5–34)
Alkaline Phosphatase: 103 U/L (ref 40–150)
Anion gap: 8 (ref 3–11)
BUN: 24 mg/dL (ref 7–26)
CHLORIDE: 104 mmol/L (ref 98–109)
CO2: 25 mmol/L (ref 22–29)
CREATININE: 1.1 mg/dL (ref 0.70–1.30)
Calcium: 9.4 mg/dL (ref 8.4–10.4)
GFR calc Af Amer: >60 mL/min (ref >60)
GFR calc non Af Amer: >60 mL/min (ref >60)
GLUCOSE: 131 mg/dL (ref 70–140)
Potassium: 3.7 mmol/L (ref 3.5–5.1)
SODIUM: 137 mmol/L (ref 136–145)
Total Bilirubin: 0.4 mg/dL (ref 0.2–1.2)
Total Protein: 6.6 g/dL (ref 6.4–8.3)

## 2017-10-16 NOTE — Telephone Encounter (Signed)
Return if symptoms worsen or fail to improve per 4/8 los

## 2017-10-18 DIAGNOSIS — M545 Low back pain: Secondary | ICD-10-CM | POA: Diagnosis not present

## 2017-10-23 DIAGNOSIS — M545 Low back pain: Secondary | ICD-10-CM | POA: Diagnosis not present

## 2017-10-23 DIAGNOSIS — M4696 Unspecified inflammatory spondylopathy, lumbar region: Secondary | ICD-10-CM | POA: Diagnosis not present

## 2017-10-25 ENCOUNTER — Encounter: Payer: Self-pay | Admitting: Hematology and Oncology

## 2017-10-25 NOTE — Progress Notes (Signed)
[facility] Follow-up Visit:  Assessment: Secondary erythrocytosis 62 y.o. male with extensive smoking history and recent diagnosis of COPD refer to the clinic for elevated hemoglobin value and leukocytosis.  At the time of evaluation, patient was smoking actively.  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  Additional assessment with imaging of the abdomen did not reveal any lesions in the liver or kidney suggest inappropriate erythropoietin production.  Since last visit to the clinic, patient complains of increased symptoms of dyspnea with exertion and shortness of breath following a bout of pneumonia in December.  Oxygen saturations at rest appeared to be normal.  Hemoglobin level is stable, but white blood cell count is significantly elevated over the patient's baseline, likely due to recent use of steroids.  Plan: -Recommend oxygen demand assessment at nighttime and during ambulation by primary care provider. -Recommend CBC by primary care provider every 3 months and referral back to hematology for possible therapeutic phlebotomy if hematocrit exceeds 55%. -Return in our clinic as needed   No orders of the defined types were placed in this encounter.  All questions were answered.  . The patient knows to call the clinic with any problems, questions or concerns.  This note was electronically signed.    History of Presenting Illness Mitchell Dean is a 62 y.o. male followed in the [place] for evaluation of elevated white blood cell count and hemoglobin. Patient is a long-term smoker, continues currently to smoke, but is trying to reduce the number of cigarettes per day. He was recently diagnosed with COPD and is currently undergoing management by pulmonology. Patient denies any significant changes to his health other than the  respiratory symptoms. Denies any fevers, chills, night sweats. No decrease in appetite, no facial flushing, pain or itching or burning in the extremities. He denies any nausea, vomiting, early satiety, diarrhea, or constipation.  Patient returns to the clinic for continued hematological monitoring.  He appears to have had a bout of significant pneumonia in December prior to Christmas and has reported difficulties recovering from the illness was persistent shortness of breath and dyspnea on exertion.  Denies chest pain, cough, palpitations.  No lightheadedness and dizziness.  Denies any facial flushing, vision changes, weakness, or sensory deficits in the extremities.  Oncological/hematological History: --Labs, 08/03/16: WBC 16.0, Hgb 19.5, MCV 89.9, MCH 32.2, RDW 13.9, Plt 177; --Labs, 08/11/16: WBC   9.5, Hgb 18.4, MCV 91.0, MCH 30.9, RDW 14.0, Plt 194;  --Labs, 09/09/16: WBC   8.8, Hgb 17.9, MCV 91.8, MCH 31.7, RDW 14.3, Plt 177; --Labs, 12/22/16: WBC   8.7, Hgb 18.1, MCV 91.8, MCH 31.6, RDW 14.6, Plt 186; --Labs, 02/10/17: WBC 10.7, Hgb 19.2, MCV 91.3, MCH 32.2, RDW 14.3, Plt 168; Epo 25.8(up) --MRI Abdomen, 04/09/17: No evidence of abnormalities or mass lesions in the liver or kidneys. --Labs, 04/17/17: WBC   8.8, Hgb 16.8, MCV 92.2, MCH 31.9, RDW 14.1, Plt 186;  --Labs, 10/16/17: WBC 14.0, Hgb 16.8, Hct 48.6, Plt 190;     Medical History: Past Medical History:  Diagnosis Date  . Arthritis    hands  . Constipation   . COPD (chronic obstructive pulmonary disease) ([place])   . Depression   . Mild acid reflux    Minimal  . Prolapsed internal hemorrhoids     Surgical History: Past Surgical History:  Procedure Laterality Date  . APPENDECTOMY  2001  .  HEMORRHOID SURGERY N/A 04/02/2014   Procedure: EXCISION OF INTERNAL HEMORRHOIDS;  Surgeon: Leighton Ruff, MD;  Location: [facility];  Service: General;  Laterality: N/A;  . I & D  RIGHT LEG INFECTION, EXTENSIVE  AGE 59  .  LEG SURGERY Right     Family History: Family History  Problem Relation Age of Onset  . Emphysema Father   . Hypertension Neg Hx   . Cancer Neg Hx   . Stroke Neg Hx     Social History: Social History   Socioeconomic History  . Marital status: Single    Spouse name: Not on file  . Number of children: Not on file  . Years of education: Not on file  . Highest education level: Not on file  Occupational History  . Not on file  Social Needs  . Financial resource strain: Not on file  . Food insecurity:    Worry: Not on file    Inability: Not on file  . Transportation needs:    Medical: Not on file    Non-medical: Not on file  Tobacco Use  . Smoking status: Current Every Day Smoker    Packs/day: 0.50    Years: 45.00    Pack years: 22.50    Types: Cigarettes    Start date: 02/06/1971  . Smokeless tobacco: Never Used  . Tobacco comment: 8-9 cigarettes/day  Substance and Sexual Activity  . Alcohol use: Yes    Alcohol/week: 0.6 oz    Types: 1 Cans of beer per week    Comment: Weekly  . Drug use: No  . Sexual activity: Not on file  Lifestyle  . Physical activity:    Days per week: Not on file    Minutes per session: Not on file  . Stress: Not on file  Relationships  . Social connections:    Talks on phone: Not on file    Gets together: Not on file    Attends religious service: Not on file    Active member of club or organization: Not on file    Attends meetings of clubs or organizations: Not on file    Relationship status: Not on file  . Intimate partner violence:    Fear of current or ex partner: Not on file    Emotionally abused: Not on file    Physically abused: Not on file    Forced sexual activity: Not on file  Other Topics Concern  . Not on file  Social History Narrative   [facility] (08/15/16):   Originally from [facility]. Previously worked in [employer]. Patient has lived in [place]. No pets currently. No mold or bird exposure.      Allergies: Allergies  Allergen Reactions  . Contrast Media [Iodinated Diagnostic Agents] Swelling    Eye swelling    Medications:  Current Outpatient Medications  Medication Sig Dispense Refill  . albuterol (PROVENTIL HFA;VENTOLIN HFA) 108 (90 Base) MCG/ACT inhaler Inhale 2 puffs into the lungs every 4 (four) hours as needed for wheezing or shortness of breath. (Patient not taking: Reported on 04/17/2017) 1 Inhaler 2  . ALPRAZolam (XANAX) 1 MG tablet Take 1 mg by mouth 3 (three) times daily as needed for anxiety.  5  . CHANTIX 1 MG tablet Take 1 mg by mouth 2 (two) times daily.  3  . FLUoxetine (PROZAC) 10 MG capsule Take 10 mg by mouth every morning.  0  . Multiple Vitamin (MULTIVITAMIN) tablet Take 1 tablet by mouth  daily.    . zolpidem (AMBIEN) 10 MG tablet Take 10 mg by mouth at bedtime.     No current facility-administered medications for this visit.     Review of Systems: Review of Systems  Respiratory: Positive for shortness of breath.   All other systems reviewed and are negative.    PHYSICAL EXAMINATION Blood pressure (!) 142/75, pulse 82, temperature 98.1 F (36.7 C), temperature source Oral, resp. rate 18, height _0  (1.702 m), weight 197 lb 11.2 oz (89.7 kg), SpO2 98 %.  ECOG PERFORMANCE STATUS: 1 - Symptomatic but completely ambulatory  Physical Exam  Constitutional: He is oriented to person, place, and time and well-developed, well-nourished, and in no distress. No distress.  HENT:  Head: Normocephalic and atraumatic.  Mouth/Throat: Oropharynx is clear and moist. No oropharyngeal exudate.  Eyes: Pupils are equal, round, and reactive to light. EOM are normal. No scleral icterus.  Neck: Normal range of motion. No JVD present. No thyromegaly present.  Cardiovascular: Normal rate and regular rhythm. Exam reveals no friction rub.  No murmur heard. Pulmonary/Chest: Effort normal and breath sounds normal. He has no wheezes. He has no rales.  Abdominal: Soft.  He exhibits no distension and no mass. There is no tenderness. There is no guarding.  Musculoskeletal: Normal range of motion. He exhibits no edema.  Lymphadenopathy:    He has no cervical adenopathy.  Neurological: He is alert and oriented to person, place, and time. He has normal reflexes. No cranial nerve deficit.  Skin: Skin is warm and dry. He is not diaphoretic. No erythema. No pallor.     LABORATORY DATA: I have personally reviewed the data as listed: Appointment on 10/16/2017  Component Date Value Ref Range Status  . Sodium 10/16/2017 137  136 - 145 mmol/L Final  . Potassium 10/16/2017 3.7  3.5 - 5.1 mmol/L Final  . Chloride 10/16/2017 104  98 - 109 mmol/L Final  . CO2 10/16/2017 25  22 - 29 mmol/L Final  . Glucose, Bld 10/16/2017 131  70 - 140 mg/dL Final  . BUN 10/16/2017 24  7 - 26 mg/dL Final  . Creatinine, Ser 10/16/2017 1.10  0.70 - 1.30 mg/dL Final  . Calcium 10/16/2017 9.4  8.4 - 10.4 mg/dL Final  . Total Protein 10/16/2017 6.6  6.4 - 8.3 g/dL Final  . Albumin 10/16/2017 3.7  3.5 - 5.0 g/dL Final  . AST 10/16/2017 13  5 - 34 U/L Final  . ALT 10/16/2017 29  0 - 55 U/L Final  . Alkaline Phosphatase 10/16/2017 103  40 - 150 U/L Final  . Total Bilirubin 10/16/2017 0.4  0.2 - 1.2 mg/dL Final  . GFR calc non Af Amer 10/16/2017 >60  >60 mL/min Final  . GFR calc Af Amer 10/16/2017 >60  >60 mL/min Final   Comment: (NOTE) The eGFR has been calculated using the CKD EPI equation. This calculation has not been validated in all clinical situations. eGFR's persistently <60 mL/min signify possible Chronic Kidney Disease.   Georgiann Hahn gap 10/16/2017 8  3 - 11 Final   Performed at [facility], [place] [address], [place], [place] [address]  . WBC 10/16/2017 14.0* 4.0 - 10.3 K/uL Final  . RBC 10/16/2017 5.39  4.20 - 5.82 MIL/uL Final  . Hemoglobin 10/16/2017 16.8  13.0 - 17.1 g/dL Final  . HCT 10/16/2017 48.6  38.4 - 49.9 % Final  . MCV 10/16/2017 90.2  79.3 -  98.0 fL Final  . MCH  10/16/2017 31.2  27.2 - 33.4 pg Final  . MCHC 10/16/2017 34.6  32.0 - 36.0 g/dL Final  . RDW 10/16/2017 15.5* 11.0 - 14.6 % Final  . Platelets 10/16/2017 190  140 - 400 K/uL Final  . Neutrophils Relative % 10/16/2017 76  % Final  . Neutro Abs 10/16/2017 10.6* 1.5 - 6.5 K/uL Final  . Lymphocytes Relative 10/16/2017 18  % Final  . Lymphs Abs 10/16/2017 2.5  0.9 - 3.3 K/uL Final  . Monocytes Relative 10/16/2017 6  % Final  . Monocytes Absolute 10/16/2017 0.9  0.1 - 0.9 K/uL Final  . Eosinophils Relative 10/16/2017 0  % Final  . Eosinophils Absolute 10/16/2017 0.0  0.0 - 0.5 K/uL Final  . Basophils Relative 10/16/2017 0  % Final  . Basophils Absolute 10/16/2017 0.0  0.0 - 0.1 K/uL Final   Performed at [facility], [place] [address] [place], [place] [address]       Ardath Sax, MD

## 2017-10-25 NOTE — Assessment & Plan Note (Signed)
62 y.o. male with extensive smoking history and recent diagnosis of COPD refer to the clinic for elevated hemoglobin value and leukocytosis.  At the time of evaluation, patient was smoking actively.  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  Additional assessment with imaging of the abdomen did not reveal any lesions in the liver or kidney suggest inappropriate erythropoietin production.  Since last visit to the clinic, patient complains of increased symptoms of dyspnea with exertion and shortness of breath following a bout of pneumonia in December.  Oxygen saturations at rest appeared to be normal.  Hemoglobin level is stable, but white blood cell count is significantly elevated over the patient's baseline, likely due to recent use of steroids.  Plan: -Recommend oxygen demand assessment at nighttime and during ambulation by primary care provider. -Recommend CBC by primary care provider every 3 months and referral back to hematology for possible therapeutic phlebotomy if hematocrit exceeds 55%. -Return in our clinic as needed

## 2017-10-30 DIAGNOSIS — M47816 Spondylosis without myelopathy or radiculopathy, lumbar region: Secondary | ICD-10-CM | POA: Diagnosis not present

## 2017-11-14 DIAGNOSIS — M47816 Spondylosis without myelopathy or radiculopathy, lumbar region: Secondary | ICD-10-CM | POA: Diagnosis not present

## 2017-12-11 DIAGNOSIS — M47816 Spondylosis without myelopathy or radiculopathy, lumbar region: Secondary | ICD-10-CM | POA: Diagnosis not present

## 2018-01-01 DIAGNOSIS — F419 Anxiety disorder, unspecified: Secondary | ICD-10-CM | POA: Diagnosis not present

## 2018-01-01 DIAGNOSIS — Z23 Encounter for immunization: Secondary | ICD-10-CM | POA: Diagnosis not present

## 2018-01-01 DIAGNOSIS — Z0000 Encounter for general adult medical examination without abnormal findings: Secondary | ICD-10-CM | POA: Diagnosis not present

## 2018-01-01 DIAGNOSIS — F329 Major depressive disorder, single episode, unspecified: Secondary | ICD-10-CM | POA: Diagnosis not present

## 2018-01-01 DIAGNOSIS — Z125 Encounter for screening for malignant neoplasm of prostate: Secondary | ICD-10-CM | POA: Diagnosis not present

## 2018-01-01 DIAGNOSIS — E7800 Pure hypercholesterolemia, unspecified: Secondary | ICD-10-CM | POA: Diagnosis not present

## 2018-01-01 DIAGNOSIS — D582 Other hemoglobinopathies: Secondary | ICD-10-CM | POA: Diagnosis not present

## 2018-01-22 DIAGNOSIS — D72829 Elevated white blood cell count, unspecified: Secondary | ICD-10-CM | POA: Diagnosis not present

## 2018-04-04 DIAGNOSIS — Z23 Encounter for immunization: Secondary | ICD-10-CM | POA: Diagnosis not present

## 2018-04-30 IMAGING — CR DG CHEST 2V
2 series · 2 of 2 positions shown · non-contrast
Comparison: None.

CLINICAL DATA: Shortness of breath and wheezing.  Smoker.

EXAM:
CHEST  2 VIEW

[w chest pa]
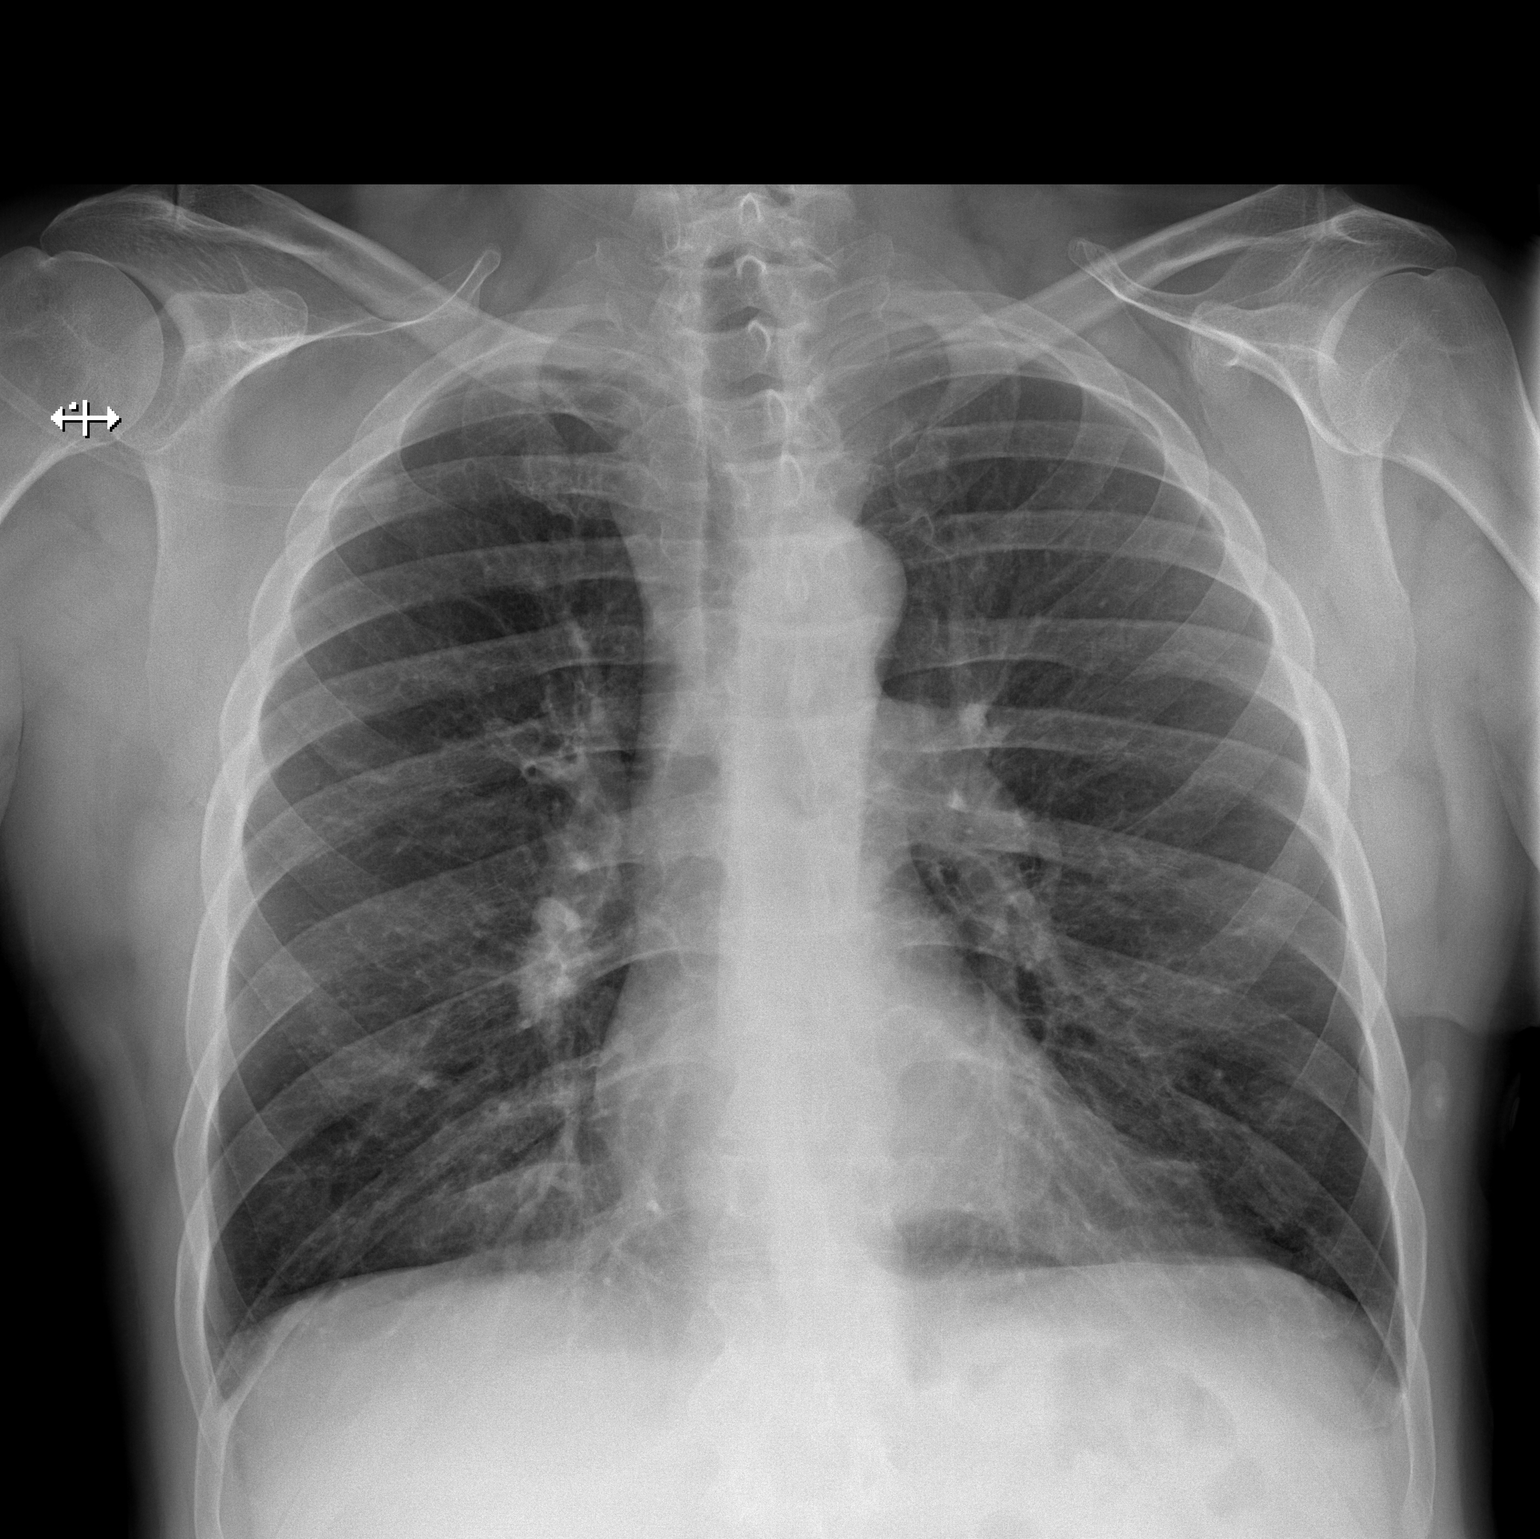

[w chest lat]
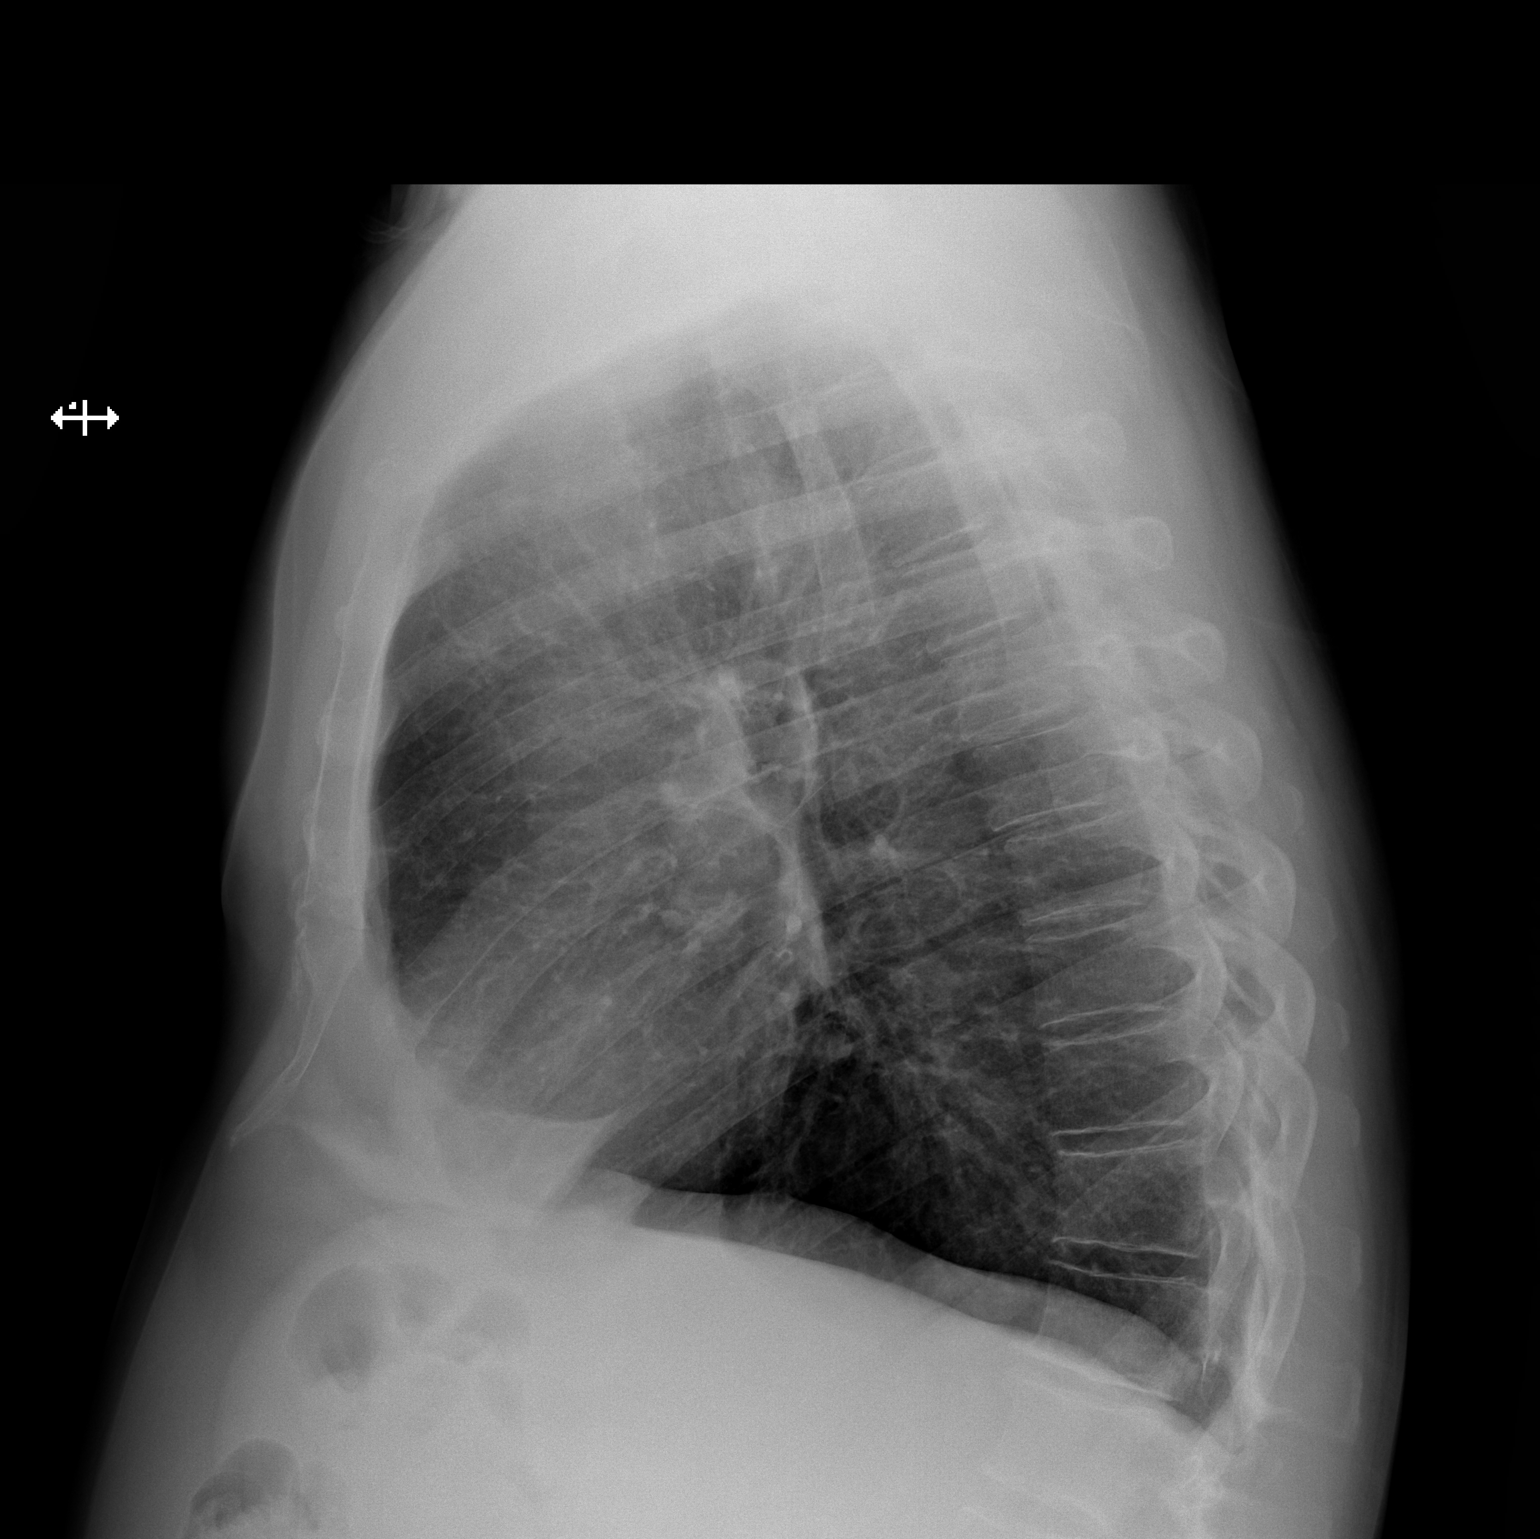

[2 of 2 positions shown; findings below may reference images not displayed]

FINDINGS: Cardiomediastinal silhouette is normal. No pleural effusions or
focal consolidations. Hyperinflation, flattened hemidiaphragm.
Trachea projects midline and there is no pneumothorax. Soft tissue
planes and included osseous structures are non-suspicious.
IMPRESSION: Hyperinflation without focal consolidation.

## 2018-06-01 DIAGNOSIS — K5901 Slow transit constipation: Secondary | ICD-10-CM | POA: Diagnosis not present

## 2018-06-15 DIAGNOSIS — Z1211 Encounter for screening for malignant neoplasm of colon: Secondary | ICD-10-CM | POA: Diagnosis not present

## 2018-06-15 DIAGNOSIS — D124 Benign neoplasm of descending colon: Secondary | ICD-10-CM | POA: Diagnosis not present

## 2018-06-15 DIAGNOSIS — K5730 Diverticulosis of large intestine without perforation or abscess without bleeding: Secondary | ICD-10-CM | POA: Diagnosis not present

## 2018-06-15 DIAGNOSIS — D127 Benign neoplasm of rectosigmoid junction: Secondary | ICD-10-CM | POA: Diagnosis not present

## 2018-06-15 DIAGNOSIS — K635 Polyp of colon: Secondary | ICD-10-CM | POA: Diagnosis not present

## 2018-06-15 DIAGNOSIS — D123 Benign neoplasm of transverse colon: Secondary | ICD-10-CM | POA: Diagnosis not present

## 2018-06-15 DIAGNOSIS — D120 Benign neoplasm of cecum: Secondary | ICD-10-CM | POA: Diagnosis not present

## 2018-06-19 DIAGNOSIS — D123 Benign neoplasm of transverse colon: Secondary | ICD-10-CM | POA: Diagnosis not present

## 2018-06-19 DIAGNOSIS — Z1211 Encounter for screening for malignant neoplasm of colon: Secondary | ICD-10-CM | POA: Diagnosis not present

## 2018-06-19 DIAGNOSIS — D124 Benign neoplasm of descending colon: Secondary | ICD-10-CM | POA: Diagnosis not present

## 2018-06-19 DIAGNOSIS — D120 Benign neoplasm of cecum: Secondary | ICD-10-CM | POA: Diagnosis not present

## 2018-06-26 DIAGNOSIS — F17200 Nicotine dependence, unspecified, uncomplicated: Secondary | ICD-10-CM | POA: Diagnosis not present

## 2018-06-26 DIAGNOSIS — E7800 Pure hypercholesterolemia, unspecified: Secondary | ICD-10-CM | POA: Diagnosis not present

## 2018-06-26 DIAGNOSIS — Z23 Encounter for immunization: Secondary | ICD-10-CM | POA: Diagnosis not present

## 2018-06-26 DIAGNOSIS — F419 Anxiety disorder, unspecified: Secondary | ICD-10-CM | POA: Diagnosis not present

## 2018-06-26 DIAGNOSIS — G4700 Insomnia, unspecified: Secondary | ICD-10-CM | POA: Diagnosis not present

## 2018-06-26 DIAGNOSIS — J449 Chronic obstructive pulmonary disease, unspecified: Secondary | ICD-10-CM | POA: Diagnosis not present

## 2018-07-09 DIAGNOSIS — H6522 Chronic serous otitis media, left ear: Secondary | ICD-10-CM | POA: Diagnosis not present

## 2018-10-10 NOTE — Progress Notes (Signed)
Virtual Visit via Telephone Note  I connected with Mitchell Dean on 10/11/18 at 10:00 AM EDT by telephone and verified that I am speaking with the correct person using two identifiers.   I discussed the limitations, risks, security and privacy concerns of performing an evaluation and management service by telephone and the availability of in person appointments. I also discussed with the patient that there may be a patient responsible charge related to this service. The patient expressed understanding and agreed to proceed.  10/11/2018 - 7253 - Attempted to reach, lvm 10/11/2018 - 0919 - Reached   History of Present Illness: 63 year old male smoker followed in our office for COPD.  Last seen in office in June/2018 he is a former patient of Dr. Ashok Cordia.  Patient consented to consult via telephone: Yes  People present and their role in pt care: PT  Chief complaint: COPD, Current Smoker   63 year old male current every day smoker last seen in our office by Dr. Ashok Cordia in June/2018.  At that point in time he was tried on a Trelegy Ellipta inhaler.  Patient reports that he stopped using this because it has a dry powder and he did not like the dry powder at all.  He is now currently managed on Symbicort 160.  Patient reports he is using his rescue inhaler 1-3 times daily.  He is frustrated by this because the risk inhaler is expensive and he does not feel that his breathing is well controlled.  He has actually increased his smoking since last office visit and has stopped his Chantix.  He is now smoking 10 to 12 cigarettes a day.  He understands this does not help manage his breathing.  Patient has a baseline cough with yellow-tinged mucus.  He denies acute or worsening symptoms at this time.  He denies wheezing.  See smoking cessation assessment below.  MMRC - Breathlessness Score 3 - I stop for breath after walking about 100 yards or after a few minutes on level ground (isle at grocery store is  113ft)   Smoking assessment and cessation counseling  Patient currently smoking: 10-12 cigarettes  I have advised the patient to quit/stop smoking as soon as possible due to high risk for multiple medical problems.  It will also be very difficult for [place] to manage patient's  respiratory symptoms and status if we continue to expose her lungs to a known irritant.  We do not advise e-cigarettes as a form of stopping smoking.  Patient is willing to quit smoking. Pt has not set quit date.   I have advised the patient that we can assist and have options of nicotine replacement therapy, provided smoking cessation education today, provided smoking cessation counseling, and provided cessation resources. Chantix did not work for patient. Pt reported rash from chantix. Willing to try nicotine replacement.   Follow-up next office visit office visit for assessment of smoking cessation.  Smoking cessation counseling advised for: 8 min    Observations/Objective:  PFT 12/30/16: FVC 3.02 L (70%) FEV1 2.01 L (62%) FEV1/FVC 0.66 FEF 25-75 1.03 L (38%) negative bronchodilator response 09/28/16: FVC 3.12 L (73%) FEV1 2.26 L (71%) FEV1/FVC 0.73 FEF 25-75 1.58 L (60%) positive bronchodilator response TLC 6.64 L (103%) RV 155% ERV 43% DLCO corrected 66%  6MWT 09/28/16:  Walked 240 meters / Baseline Sat 97% on RA / Nadir Sat 98% on RA (stopped w/ 20 seconds remaining due to back pain)  IMAGING CXR PA/LAT 08/03/16 (previously reviewed by me): Hyperinflation with  flattening of the diaphragms. No focal parenchymal opacity or mass appreciated. Heart normal in size & mediastinum normal in contour. No pleural effusion.  LABS 09/28/16 Alpha-1 antitrypsin: MM (137)  08/04/16 ABG on RA:  7.416 / 31.3 / 58.1   No results found for: NITRICOXIDE   Assessment and Plan:  COPD mixed type [facility]) Assessment: March/2018 PFT shows an FEV1 of 2.26 (71% predicted), positive bronchodilator response, DLCO corrects to  65 Managed on Symbicort 160 Using rescue inhaler 1-3 times daily Current every day smoker mMRC 3 today  Plan: Continue Symbicort 160 Start Spiriva Respimat 2.5 >>>Reviewed YouTube video link to educate yourself on how to take Spiriva Respimat 2.5 You need to stop smoking Referral to lung cancer screening program We need to complete pulmonary function testing in the next 6 months to a year to further assess as you have had ongoing smoking May need to consider pulmonary rehab in the future Follow-up with Dr. Lamonte Sakai as planned in July/2020 to establish care  Tobacco use disorder Assessment: Current every day smoker Smoking 10 to 12 cigarettes a day 67.5-pack-year smoking history Has previously tried Chantix in the past but never stop smoking Is willing to try nicotine replacement therapies Is hesitant to set a quit date  Plan: You need to stop smoking You need to set a quit date Prescription for 21 mg nicotine patch sent Prescription for 4 mg nicotine lozenges sent Explained to patient that he needs to read the patient instructions on his AVS from this telephonic visit Referral to lung cancer screening program based off of pack-year smoking history    Follow Up Instructions:  Return in about 16 weeks (around 01/31/2019), or if symptoms worsen or fail to improve, for Follow up with Dr. Lamonte Sakai, Follow up with Wyn Quaker FNP-C.   I discussed the assessment and treatment plan with the patient. The patient was provided an opportunity to ask questions and all were answered. The patient agreed with the plan and demonstrated an understanding of the instructions.   The patient was advised to call back or seek an in-person evaluation if the symptoms worsen or if the condition fails to improve as anticipated.  I provided 34 minutes of non-face-to-face time during this encounter.  Lauraine Rinne, NP

## 2018-10-11 ENCOUNTER — Other Ambulatory Visit: Payer: Self-pay

## 2018-10-11 ENCOUNTER — Encounter: Payer: Self-pay | Admitting: Pulmonary Disease

## 2018-10-11 ENCOUNTER — Ambulatory Visit (INDEPENDENT_AMBULATORY_CARE_PROVIDER_SITE_OTHER): Payer: Self-pay | Admitting: Pulmonary Disease

## 2018-10-11 DIAGNOSIS — F17210 Nicotine dependence, cigarettes, uncomplicated: Secondary | ICD-10-CM

## 2018-10-11 DIAGNOSIS — J449 Chronic obstructive pulmonary disease, unspecified: Secondary | ICD-10-CM

## 2018-10-11 DIAGNOSIS — F17200 Nicotine dependence, unspecified, uncomplicated: Secondary | ICD-10-CM

## 2018-10-11 MED ORDER — NICOTINE 21 MG/24HR TD PT24: 21.0000 mg | MEDICATED_PATCH | Freq: Every day | TRANSDERMAL | 3 refills | Status: DC

## 2018-10-11 MED ORDER — TIOTROPIUM BROMIDE MONOHYDRATE 2.5 MCG/ACT IN AERS: 2.0000 | INHALATION_SPRAY | Freq: Every day | RESPIRATORY_TRACT | 3 refills | Status: DC

## 2018-10-11 MED ORDER — NICOTINE POLACRILEX 4 MG MT LOZG: 4.0000 mg | LOZENGE | OROMUCOSAL | 3 refills | Status: DC | PRN

## 2018-10-11 NOTE — Assessment & Plan Note (Signed)
Assessment: Current every day smoker Smoking 10 to 12 cigarettes a day 67.5-pack-year smoking history Has previously tried Chantix in the past but never stop smoking Is willing to try nicotine replacement therapies Is hesitant to set a quit date  Plan: You need to stop smoking You need to set a quit date Prescription for 21 mg nicotine patch sent Prescription for 4 mg nicotine lozenges sent Explained to patient that he needs to read the patient instructions on his AVS from this telephonic visit Referral to lung cancer screening program based off of pack-year smoking history

## 2018-10-11 NOTE — Assessment & Plan Note (Addendum)
Assessment: March/2018 PFT shows an FEV1 of 2.26 (71% predicted), positive bronchodilator response, DLCO corrects to 66 Managed on Symbicort 160 Using rescue inhaler 1-3 times daily Current every day smoker mMRC 3 today  Plan: Continue Symbicort 160 Start Spiriva Respimat 2.5 >>>Reviewed YouTube video link to educate yourself on how to take Spiriva Respimat 2.5 You need to stop smoking Referral to lung cancer screening program We need to complete pulmonary function testing in the next 6 months to a year to further assess as you have had ongoing smoking May need to consider pulmonary rehab in the future Follow-up with Dr. Lamonte Sakai as planned in July/2020 to establish care

## 2018-10-11 NOTE — Patient Instructions (Addendum)
Continue Symbicort 160 >>> 2 puffs in the morning right when you wake up, rinse out your mouth after use, 12 hours later 2 puffs, rinse after use >>> Take this daily, no matter what >>> This is not a rescue inhaler   Start Spiriva Respimat 2.5 >>> 2 puffs daily >>> Do this every day >>>This is not a rescue inhaler  Youtube video - Teach Spiriva Respimat  [email or web address]   Only use your albuterol as a rescue medication to be used if you can't catch your breath by resting or doing a relaxed purse lip breathing pattern.  - The less you use it, the better it will work when you need it. - Ok to use up to 2 puffs  every 4 hours if you must but call for immediate appointment if use goes up over your usual need - Don't leave home without it !!  (think of it like the spare tire for your car)    Note your daily symptoms > remember "red flags" for COPD:   >>>Increase in cough >>>increase in sputum production >>>increase in shortness of breath or activity  intolerance.   If you notice these symptoms, please call the office to be seen.   Could consider pulmonary rehab referral in the future    We recommend that you stop smoking.  >>>You need to set a quit date >>>If you have friends or family who smoke, let them know you are trying to quit and not to smoke around you or in your living environment  Smoking Cessation Resources:  1 800 QUIT NOW  >>> Patient to call this resource and utilize it to help support her quit smoking >>> Keep up your hard work with stopping smoking  You can also contact the [facility] >>>For smoking cessation classes call [phone]  We do not recommend using e-cigarettes as a Dean of stopping smoking  You can sign up for smoking cessation support texts and information:  >>>https://smokefree.gov/smokefreetxt   Nicotine patches: >>>Make sure you rotate sites that you do not get skin irritation, Apply 1 patch each  morning to a non-hairy skin site  If you are smoking greater than 10 cigarettes/day and weigh over 45 kg start with the nicotine patch of 21 mg a day for 6 weeks, then 14 mg a day for 2 weeks, then finished with 7 mg a day for 2 weeks, then stop  >>>If insomnia occurs you are having trouble sleeping you can take the patch off at night, and place a new one on in the morning >>>If the patch is removed at night and you have morning cravings start short acting nicotine replacement therapy such as gum or lozenges  >>>Avoid acidic beverages such as coffee, carbonated beverages before and during gum/lozenge use.  A soft acidic beverages lower oral pH which cause nicotine to not be absorbed properly >>>If you chew the gum too quickly or vigorously you could have nausea, vomiting, abdominal pain, constipation, hiccups, headache, sore jaw, mouth irritation ulcers  Nicotine lozenge: Lozenges are commonly uses short acting NRT product  >>>Smokers who smoke within 30 minutes of awakening should use 4 mg dose  Can use up to 1 lozenge every 1-2 hours for 6 weeks >>>Total amount of lozenges that can be used per day as 20 >>>Gradually reduce number of lozenges used per day after 2 weeks of use  Place lozenge in mouth and allowed to dissolve for 30 minutes loss and does not need to be chewed  Lozenges have advantages to be able to be used in people with TMG, poor dentition, dentures    We will refer you today to her lung cancer screening program >>>This is based off of your 67.5 pack-year smoking history >>> This is a recommendation from the [place] preventative services task force (USPSTF) >>>The USPSTF recommends annual screening for lung cancer with low-dose computed tomography (LDCT) in adults aged 57 to 80 years who have a 30 pack-year smoking history and currently smoke or have quit within the past 15 years. Screening should be discontinued once a person has not smoked for 15 years or develops a health  problem that substantially limits life expectancy or the ability or willingness to have curative lung surgery.   Our office will call you and set up an appointment with Mitchell Dean (Nurse Practitioner) who leads this program.  This appointment takes place in our office.  After completing this meeting with Mitchell Form NP you will get a low-dose CT as the screening >>>We will call you with those results   Return in about 16 weeks (around 01/31/2019), or if symptoms worsen or fail to improve, for Follow up with Dr. Lamonte Dean, Follow up with Mitchell Dean.    Coronavirus (COVID-19) Are you at risk?  Are you at risk for the Coronavirus (COVID-19)?  To be considered HIGH RISK for Coronavirus (COVID-19), you have to meet the following criteria:  . Traveled to [place], [place], [place], [place] or [place]; or in the [place] to [place], [place], [place], or [place]; and have fever, cough, and shortness of breath within the last 2 weeks of travel OR . Been in close contact with a person diagnosed with COVID-19 within the last 2 weeks and have fever, cough, and shortness of breath . IF YOU DO NOT MEET THESE CRITERIA, YOU ARE CONSIDERED LOW RISK FOR COVID-19.  What to do if you are HIGH RISK for COVID-19?  Marland Kitchen If you are having a medical emergency, call 911. . Seek medical care right away. Before you go to a doctor's office, urgent care or emergency department, call ahead and tell them about your recent travel, contact with someone diagnosed with COVID-19, and your symptoms. You should receive instructions from your physician's office regarding next steps of care.  . When you arrive at healthcare provider, tell the healthcare staff immediately you have returned from visiting [place], [place], [place], [place] or [place]; or traveled in the [place] to [place], [place], [place], or [place]; in the last two weeks or you have been in close contact with a person diagnosed with COVID-19  in the last 2 weeks.   . Tell the health care staff about your symptoms: fever, cough and shortness of breath. . After you have been seen by a medical provider, you will be either: o Tested for (COVID-19) and discharged home on quarantine except to seek medical care if symptoms worsen, and asked to  - Stay home and avoid contact with others until you get your results (4-5 days)  - Avoid travel on public transportation if possible (such as bus, train, or airplane) or o Sent to the Emergency Department by EMS for evaluation, COVID-19 testing, and possible admission depending on your condition and test results.  What to do if you are LOW RISK for COVID-19?  Reduce your risk of any infection by using the same precautions used for avoiding the common cold or flu:  Marland Kitchen Wash your hands often with  soap and warm water for at least 20 seconds.  If soap and water are not readily available, use an alcohol-based hand sanitizer with at least 60% alcohol.  . If coughing or sneezing, cover your mouth and nose by coughing or sneezing into the elbow areas of your shirt or coat, into a tissue or into your sleeve (not your hands). . Avoid shaking hands with others and consider head nods or verbal greetings only. . Avoid touching your eyes, nose, or mouth with unwashed hands.  . Avoid close contact with people who are sick. . Avoid places or events with large numbers of people in one location, like concerts or sporting events. . Carefully consider travel plans you have or are making. . If you are planning any travel outside or inside the [place], visit the CDC's Travelers' Health webpage for the latest health notices. . If you have some symptoms but not all symptoms, continue to monitor at home and seek medical attention if your symptoms worsen. . If you are having a medical emergency, call 911.   [place] / e-Visit:  [email or web address]         [facility]: [facility]: [phone]                   [facility]: [phone]           It is flu season:   >>> Best ways to protect herself from the flu: Receive the yearly flu vaccine, practice good hand hygiene washing with soap and also using hand sanitizer when available, eat a nutritious meals, get adequate rest, hydrate appropriately   Please contact the office if your symptoms worsen or you have concerns that you are not improving.   Thank you for choosing [facility] for your healthcare, and for allowing [place] to partner with you on your healthcare journey. I am thankful to be able to provide care to you today.   Mitchell Dean   Chronic Obstructive Pulmonary Disease Chronic obstructive pulmonary disease (COPD) is a long-term (chronic) lung problem. When you have COPD, it is hard for air to get in and out of your lungs. Usually the condition gets worse over time, and your lungs will never return to normal. There are things you can do to keep yourself as healthy as possible.  Your doctor may treat your condition with: ? Medicines. ? Oxygen. ? Lung surgery.  Your doctor may also recommend: ? Rehabilitation. This includes steps to make your body work better. It may involve a team of specialists. ? Quitting smoking, if you smoke. ? Exercise and changes to your diet. ? Comfort measures (palliative care). Follow these instructions at home: Medicines  Take over-the-counter and prescription medicines only as told by your doctor.  Talk to your doctor before taking any cough or allergy medicines. You may need to avoid medicines that cause your lungs to be dry. Lifestyle  If you smoke, stop. Smoking makes the problem worse. If you need help quitting, ask your doctor.  Avoid being around things that make your breathing worse. This may  include smoke, chemicals, and fumes.  Stay active, but remember to rest as well.  Learn and use tips on how to relax.  Make sure you get enough sleep. Most adults need at least 7 hours of sleep every night.  Eat healthy foods. Eat smaller meals more often. Rest before meals. Controlled breathing  Learn and use tips on how to control your breathing as told by your doctor. Try:  Breathing in (inhaling) through your nose for 1 second. Then, pucker your lips and breath out (exhale) through your lips for 2 seconds.  Putting one hand on your belly (abdomen). Breathe in slowly through your nose for 1 second. Your hand on your belly should move out. Pucker your lips and breathe out slowly through your lips. Your hand on your belly should move in as you breathe out.  Controlled coughing Learn and use controlled coughing to clear mucus from your lungs. Follow these steps: 1. Lean your head a little forward. 2. Breathe in deeply. 3. Try to hold your breath for 3 seconds. 4. Keep your mouth slightly open while coughing 2 times. 5. Spit any mucus out into a tissue. 6. Rest and do the steps again 1 or 2 times as needed. General instructions  Make sure you get all the shots (vaccines) that your doctor recommends. Ask your doctor about a flu shot and a pneumonia shot.  Use oxygen therapy and pulmonary rehabilitation if told by your doctor. If you need home oxygen therapy, ask your doctor if you should buy a tool to measure your oxygen level (oximeter).  Make a COPD action plan with your doctor. This helps you to know what to do if you feel worse than usual.  Manage any other conditions you have as told by your doctor.  Avoid going outside when it is very hot, cold, or humid.  Avoid people who have a sickness you can catch (contagious).  Keep all follow-up visits as told by your doctor. This is important. Contact a doctor if:  You cough up more mucus than usual.  There is a change in the  color or thickness of the mucus.  It is harder to breathe than usual.  Your breathing is faster than usual.  You have trouble sleeping.  You need to use your medicines more often than usual.  You have trouble doing your normal activities such as getting dressed or walking around the house. Get help right away if:  You have shortness of breath while resting.  You have shortness of breath that stops you from: ? Being able to talk. ? Doing normal activities.  Your chest hurts for longer than 5 minutes.  Your skin color is more blue than usual.  Your pulse oximeter shows that you have low oxygen for longer than 5 minutes.  You have a fever.  You feel too tired to breathe normally. Summary  Chronic obstructive pulmonary disease (COPD) is a long-term lung problem.  The way your lungs work will never return to normal. Usually the condition gets worse over time. There are things you can do to keep yourself as healthy as possible.  Take over-the-counter and prescription medicines only as told by your doctor.  If you smoke, stop. Smoking makes the problem worse. This information is not intended to replace advice given to you by your health care provider. Make sure you discuss any questions you have with your health care provider. Document Released: 12/14/2007 Document Revised: 08/01/2016 Document Reviewed: 08/01/2016 Elsevier Interactive Patient Education  2019 [place] Risks of Smoking Smoking cigarettes is very bad for your health. Tobacco smoke has over 200 known poisons in it. It contains the poisonous gases nitrogen oxide and carbon monoxide. There are over 60 chemicals in tobacco smoke that cause cancer. Smoking is difficult to quit because  a chemical in tobacco, called nicotine, causes addiction or dependence. When you smoke and inhale, nicotine is absorbed rapidly into the bloodstream through your lungs. Both inhaled and non-inhaled nicotine may be  addictive. What are the risks of cigarette smoke? Cigarette smokers have an increased risk of many serious medical problems, including:  Lung cancer.  Lung disease, such as pneumonia, bronchitis, and emphysema.  Chest pain (angina) and heart attack because the heart is not getting enough oxygen.  Heart disease and peripheral blood vessel disease.  High blood pressure (hypertension).  Stroke.  Oral cancer, including cancer of the lip, mouth, or voice box.  Bladder cancer.  Pancreatic cancer.  Cervical cancer.  Pregnancy complications, including premature birth.  Stillbirths and smaller newborn babies, birth defects, and genetic damage to sperm.  Early menopause.  Lower estrogen level for women.  Infertility.  Facial wrinkles.  Blindness.  Increased risk of broken bones (fractures).  Senile dementia.  Stomach ulcers and internal bleeding.  Delayed wound healing and increased risk of complications during surgery.  Even smoking lightly shortens your life expectancy by several years. Because of secondhand smoke exposure, children of smokers have an increased risk of the following:  Sudden infant death syndrome (SIDS).  Respiratory infections.  Lung cancer.  Heart disease.  Ear infections. What are the benefits of quitting? There are many health benefits of quitting smoking. Here are some of them:  Within days of quitting smoking, your risk of having a heart attack decreases, your blood flow improves, and your lung capacity improves. Blood pressure, pulse rate, and breathing patterns start returning to normal soon after quitting.  Within months, your lungs may clear up completely.  Quitting for 10 years reduces your risk of developing lung cancer and heart disease to almost that of a nonsmoker.  People who quit may see an improvement in their overall quality of life. How do I quit smoking?     Smoking is an addiction with both physical and  psychological effects, and longtime habits can be hard to change. Your health care provider can recommend:  Programs and community resources, which may include group support, education, or talk therapy.  Prescription medicines to help reduce cravings.  Nicotine replacement products, such as patches, gum, and nasal sprays. Use these products only as directed. Do not replace cigarette smoking with electronic cigarettes, which are commonly called e-cigarettes. The safety of e-cigarettes is not known, and some may contain harmful chemicals.  A combination of two or more of these methods. Where to find more information  American Lung Association: www.lung.org  American Cancer Society: www.cancer.org Summary  Smoking cigarettes is very bad for your health. Cigarette smokers have an increased risk of many serious medical problems, including several cancers, heart disease, and stroke.  Smoking is an addiction with both physical and psychological effects, and longtime habits can be hard to change.  By stopping right away, you can greatly reduce the risk of medical problems for you and your family.  To help you quit smoking, your health care provider can recommend programs, community resources, prescription medicines, and nicotine replacement products such as patches, gum, and nasal sprays. This information is not intended to replace advice given to you by your health care provider. Make sure you discuss any questions you have with your health care provider. Document Released: 08/04/2004 Document Revised: 09/28/2017 Document Reviewed: 07/01/2016 Elsevier Interactive Patient Education  2019 [employer].    Steps to Quit Smoking  Smoking tobacco can be bad for your  health. It can also affect almost every organ in your body. Smoking puts you and people around you at risk for many serious long-lasting (chronic) diseases. Quitting smoking is hard, but it is one of the best things that you can do  for your health. It is never too late to quit. What are the benefits of quitting smoking? When you quit smoking, you lower your risk for getting serious diseases and conditions. They can include:  Lung cancer or lung disease.  Heart disease.  Stroke.  Heart attack.  Not being able to have children (infertility).  Weak bones (osteoporosis) and broken bones (fractures). If you have coughing, wheezing, and shortness of breath, those symptoms may get better when you quit. You may also get sick less often. If you are pregnant, quitting smoking can help to lower your chances of having a baby of low birth weight. What can I do to help me quit smoking? Talk with your doctor about what can help you quit smoking. Some things you can do (strategies) include:  Quitting smoking totally, instead of slowly cutting back how much you smoke over a period of time.  Going to in-person counseling. You are more likely to quit if you go to many counseling sessions.  Using resources and support systems, such as: ? Database administrator with a Social worker. ? Phone quitlines. ? Careers information officer. ? Support groups or group counseling. ? Text messaging programs. ? Mobile phone apps or applications.  Taking medicines. Some of these medicines may have nicotine in them. If you are pregnant or breastfeeding, do not take any medicines to quit smoking unless your doctor says it is okay. Talk with your doctor about counseling or other things that can help you. Talk with your doctor about using more than one strategy at the same time, such as taking medicines while you are also going to in-person counseling. This can help make quitting easier. What things can I do to make it easier to quit? Quitting smoking might feel very hard at first, but there is a lot that you can do to make it easier. Take these steps:  Talk to your family and friends. Ask them to support and encourage you.  Call phone quitlines, reach out to  support groups, or work with a Social worker.  Ask people who smoke to not smoke around you.  Avoid places that make you want (trigger) to smoke, such as: ? Bars. ? Parties. ? Smoke-break areas at work.  Spend time with people who do not smoke.  Lower the stress in your life. Stress can make you want to smoke. Try these things to help your stress: ? Getting regular exercise. ? Deep-breathing exercises. ? Yoga. ? Meditating. ? Doing a body scan. To do this, close your eyes, focus on one area of your body at a time from head to toe, and notice which parts of your body are tense. Try to relax the muscles in those areas.  Download or buy apps on your mobile phone or tablet that can help you stick to your quit plan. There are many free apps, such as QuitGuide from the [employer] Office manager for Disease Control and Prevention). You can find more support from smokefree.gov and other websites. This information is not intended to replace advice given to you by your health care provider. Make sure you discuss any questions you have with your health care provider. Document Released: 04/23/2009 Document Revised: 02/23/2016 Document Reviewed: 11/11/2014 Elsevier Interactive Patient Education  2019 [employer].  Lung Cancer Screening A lung cancer screening is a test that checks for lung cancer. Lung cancer screening is done to look for lung cancer in its very early stages, before it spreads and becomes harder to treat and before symptoms appear. Finding cancer early improves the chances of successful treatment. It may save your life. Should I be screened for lung cancer? You should be screened for lung cancer if all of these apply:  You currently smoke or you have quit smoking within the past 15 years.  You are 11-60 years old. Screening may be recommended up to age 60 depending on your overall health and other factors.  You are in good general health.  You have a smoking history of 1 pack a day for 30  years or 2 packs a day for 15 years. Screening may also be recommended if you are at high risk for the disease. You may be at high risk if:  You have a family history of lung cancer.  You have been exposed to asbestos.  You have chronic obstructive pulmonary disease (COPD).  You have a history of previous lung cancer. How often should I be screened for lung cancer?  If you are at risk for lung cancer, it is recommended that you are screened once a year. The recommended screening test is a low-dose CT scan. How can I lower my risk of lung cancer? To lower your risk of developing lung cancer:  If you smoke, stop smoking all tobacco products.  Avoid secondhand smoke.  Avoid exposure to radiation.  Avoid exposure to radon gas. Have your home checked for radon regularly.  Avoid things that cause cancer (carcinogens).  Avoid living or working in places with high air pollution. Where to find more information Ask your health care provider about the risks and benefits of screening. More information and resources are available from these organizations:  [place] (ACS): www.cancer.org  American Lung Association: www.lung.org Contact a health care provider if:  You start to show symptoms of lung cancer, including: ? Coughing that will not go away. ? Wheezing. ? Chest pain. ? Coughing up blood. ? Shortness of breath. ? Weight loss that cannot be explained. ? Constant fatigue. Summary  Lung cancer screening may find lung cancer before symptoms appear. Finding cancer early improves the chances of successful treatment. It may save your life.  If you are at risk for lung cancer, it is recommended that you are screened once a year. The recommended screening test is a low-dose CT scan.  You can make lifestyle changes to lower your risk of lung cancer.  Ask your health care provider about the risks and benefits of screening. This information is not intended to replace  advice given to you by your health care provider. Make sure you discuss any questions you have with your health care provider. Document Released: 05/18/2016 Document Revised: 09/19/2017 Document Reviewed: 05/18/2016 Elsevier Interactive Patient Education  [employer].

## 2018-10-22 ENCOUNTER — Ambulatory Visit: Payer: BLUE CROSS/BLUE SHIELD | Admitting: Emergency Medicine

## 2018-10-25 ENCOUNTER — Telehealth: Payer: Self-pay | Admitting: Emergency Medicine

## 2018-10-25 NOTE — Telephone Encounter (Signed)
Spoke with pt regarding lung cancer screening.  Pt advised that I will call him to get him scheduled once Covid restrictions have been lifted.  Pt verbalized understanding.  Nothing further needed at this time.

## 2018-10-25 NOTE — Telephone Encounter (Signed)
Checked with Doroteo Glassman due to an order that was put in for lung cancer screening to see if she had tried calling pt and she said she did. Routing to [place] for her to follow up on.

## 2018-11-29 ENCOUNTER — Telehealth: Payer: Self-pay | Admitting: Emergency Medicine

## 2018-11-29 DIAGNOSIS — F17210 Nicotine dependence, cigarettes, uncomplicated: Secondary | ICD-10-CM

## 2018-11-29 DIAGNOSIS — Z122 Encounter for screening for malignant neoplasm of respiratory organs: Secondary | ICD-10-CM

## 2018-11-29 NOTE — Telephone Encounter (Signed)
Will route to lung screening pool.  

## 2018-12-04 NOTE — Telephone Encounter (Signed)
Spoke with pt and scheduled SD[facility] 12/19/18 12:00. CT ordered Nothing further needed

## 2018-12-18 ENCOUNTER — Telehealth: Payer: Self-pay | Admitting: *Deleted

## 2018-12-18 NOTE — Telephone Encounter (Signed)
Script Screening patients for COVID-19 and reviewing new operational procedures  Greeting - The reason I am calling is to share with you some new changes to our processes that are designed to help us keep everyone safe. Is now a good time to speak with you? Patient says "no' - ask them when you can call back and let them know it's important to do this prior to their appointment.  Patient says "yes" - Great, [name] the first thing I need to do is ask you some screening Questions.  1. To the best of your knowledge, have you been in close contact with any one with a confirmed diagnosis of COVID 19? o No - proceed to next question  2. Have you had any one or more of the following: fever, chills, cough, shortness of breath or any flu-like symptoms? o No - proceed to next question  3. Have you been diagnosed with or have a previous diagnosis of COVID 19? o No - proceed to next question  4. I am going to go over a few other symptoms with you. Please let me know if you are experiencing any of the following: . Ear, nose or throat discomfort . A sore throat . Headache . Muscle pain . Diarrhea . Loss of taste or smell o No - proceed to next question  Thank you for answering these questions. Please know we will ask you these questions or similar questions when you arrive for your appointment and again it's how we are keeping everyone safe. Also, to keep you safe, please use the provided hand sanitizer when you enter the building. [name], we are asking everyone in the building to wear a mask because they help us prevent the spread of germs. Do you have a mask of your own, if not, we are happy to provide one for you. The last thing I want to go over with you is the no visitor guidelines. This means no one can attend the appointment with you unless you need physical assistance. I understand this may be different from your past appointments and I know this may be difficult but please know if  someone is driving you we are happy to call them for you once your appointment is over.  [INSERT SITE SPECIFIC CHECK IN PROCEDURES]  [name] I've given you a lot of information, what questions do you have about what I've talked about today or your appointment tomorrow? 

## 2018-12-19 ENCOUNTER — Encounter: Payer: Self-pay | Admitting: Acute Care

## 2018-12-19 ENCOUNTER — Ambulatory Visit (INDEPENDENT_AMBULATORY_CARE_PROVIDER_SITE_OTHER)
Admission: RE | Admit: 2018-12-19 | Discharge: 2018-12-19 | Disposition: A | Discharge status: Home / Self Care | Payer: Medicare Other | Source: Ambulatory Visit | Attending: Acute Care | Admitting: Acute Care

## 2018-12-19 ENCOUNTER — Other Ambulatory Visit: Payer: Self-pay

## 2018-12-19 ENCOUNTER — Ambulatory Visit (INDEPENDENT_AMBULATORY_CARE_PROVIDER_SITE_OTHER): Payer: Medicare Other | Admitting: Acute Care

## 2018-12-19 VITALS — BP 130/80 | HR 102 | Temp 97.9°F | Ht 67.0 in | Wt 195.0 lb

## 2018-12-19 DIAGNOSIS — F17210 Nicotine dependence, cigarettes, uncomplicated: Secondary | ICD-10-CM

## 2018-12-19 DIAGNOSIS — Z122 Encounter for screening for malignant neoplasm of respiratory organs: Secondary | ICD-10-CM

## 2018-12-19 NOTE — Progress Notes (Signed)
Shared Decision Making Visit Lung Cancer Screening Program [phone])   Eligibility:  Age 63 y.o.  Pack Years Smoking History Calculation 47 pack year smoking history (# packs/per year x # years smoked)  Recent History of coughing up blood  no  Unexplained weight loss? no ( >Than 15 pounds within the last 6 months )  Prior History Lung / other cancer no (Diagnosis within the last 5 years already requiring surveillance chest CT Scans).  Smoking Status Current Smoker  Former Smokers: Years since quit: NA  Quit Date: NA  Visit Components:  Discussion included one or more decision making aids. yes  Discussion included risk/benefits of screening. yes  Discussion included potential follow up diagnostic testing for abnormal scans. yes  Discussion included meaning and risk of over diagnosis. yes  Discussion included meaning and risk of False Positives. yes  Discussion included meaning of total radiation exposure. yes  Counseling Included:  Importance of adherence to annual lung cancer LDCT screening. yes  Impact of comorbidities on ability to participate in the program. yes  Ability and willingness to under diagnostic treatment. yes  Smoking Cessation Counseling:  Current Smokers:   Discussed importance of smoking cessation. yes  Information about tobacco cessation classes and interventions provided to patient. yes  Patient provided with "ticket" for LDCT Scan. yes  Symptomatic Patient. no  Counseling  Diagnosis Code: Tobacco Use Z72.0  Asymptomatic Patient yes  Counseling (Intermediate counseling: > three minutes counseling) U0454  Former Smokers:   Discussed the importance of maintaining cigarette abstinence. yes  Diagnosis Code: Personal History of Nicotine Dependence. U98.119  Information about tobacco cessation classes and interventions provided to patient. Yes  Patient provided with "ticket" for LDCT Scan. yes  Written Order for Lung Cancer  Screening with LDCT placed in Epic. Yes (CT Chest Lung Cancer Screening Low Dose W/O CM) [id number] Z12.2-Screening of respiratory organs Z87.891-Personal history of nicotine dependence  BP 130/80 (BP Location: Left Arm, Patient Position: Sitting, Cuff Size: Normal)   Pulse (!) 102   Temp 97.9 F (36.6 C)   Ht 5\' 7"  (1.702 m)   Wt 195 lb (88.5 kg)   SpO2 96%   BMI 30.54 kg/m    I have spent 25 minutes of face to face time with Mr. Fassnacht discussing the risks and benefits of lung cancer screening. We viewed a power point together that explained in detail the above noted topics. We paused at intervals to allow for questions to be asked and answered to ensure understanding.We discussed that the single most powerful action that he can take to decrease his risk of developing lung cancer is to quit smoking. We discussed whether or not he is ready to commit to setting a quit date. We discussed options for tools to aid in quitting smoking including nicotine replacement therapy, non-nicotine medications, support groups, Quit Smart classes, and behavior modification. We discussed that often times setting smaller, more achievable goals, such as eliminating 1 cigarette a day for a week and then 2 cigarettes a day for a week can be helpful in slowly decreasing the number of cigarettes smoked. This allows for a sense of accomplishment as well as providing a clinical benefit. I gave him the " Be Stronger Than Your Excuses" card with contact information for community resources, classes, free nicotine replacement therapy, and access to mobile apps, text messaging, and on-line smoking cessation help. I have also given him my card and contact information in the event he needs to contact me. We discussed  the time and location of the scan, and that either Doroteo Glassman RN or I will call with the results within 24-48 hours of receiving them. I have offered him  a copy of the power point we viewed  as a resource in the event  they need reinforcement of the concepts we discussed today in the office. The patient verbalized understanding of all of  the above and had no further questions upon leaving the office. They have my contact information in the event they have any further questions.  I spent 4 minutes counseling on smoking cessation and the health risks of continued tobacco abuse.  I explained to the patient that there has been a high incidence of coronary artery disease noted on these exams. I explained that this is a non-gated exam therefore degree or severity cannot be determined. This patient is not on statin therapy. I have asked the patient to follow-up with their PCP regarding any incidental finding of coronary artery disease and management with diet or medication as their PCP  feels is clinically indicated. The patient verbalized understanding of the above and had no further questions upon completion of the visit.      Magdalen Spatz, NP 12/19/2018 12:31 PM

## 2018-12-21 ENCOUNTER — Telehealth: Payer: Self-pay | Admitting: Acute Care

## 2018-12-21 DIAGNOSIS — F17210 Nicotine dependence, cigarettes, uncomplicated: Secondary | ICD-10-CM

## 2018-12-21 DIAGNOSIS — Z122 Encounter for screening for malignant neoplasm of respiratory organs: Secondary | ICD-10-CM

## 2018-12-21 DIAGNOSIS — Z87891 Personal history of nicotine dependence: Secondary | ICD-10-CM

## 2018-12-21 NOTE — Telephone Encounter (Signed)
Called and spoke with pt. Pt had Low Dose CT performed 6/10 and was wanting a call either from [place] or her nurse to discuss the results of the scan with him.  Pt is aware that Judson Roch will be back at office Monday, 6/15.  Routing to both [employer] and Judson Roch.

## 2018-12-24 NOTE — Telephone Encounter (Signed)
Pt is calling back for Judson Roch for results.

## 2018-12-25 NOTE — Telephone Encounter (Signed)
Notes recorded by Magdalen Spatz, NP on 12/20/2018 at 7:29 PM EDT  Please call patient and let them know their low dose Ct was read as a Lung RADS 1, negative study: no nodules or definitely benign nodules. Radiology recommendation is for a repeat LDCT in 12 months..Please let them know we will order and schedule their annual screening scan for 12/2019. Please let them know there was notation of CAD on their scan. Please remind the patient that this is a non-gated exam therefore degree or severity of disease cannot be determined. Please have them follow up with their PCP regarding potential risk factor modification, dietary therapy or pharmacologic therapy if clinically indicated.  Pt. is not currently on statin therapy. Please place order for annual screening scan for 12/2019 and fax results to PCP. Thanks so much.   Called and spoke with pt letting him know the results of the Low Dose CT. Stated to him due to CAD being seen on the scan, he needed to follow up with PCP in regards to this and pt stated he is seeing PCP tomorrow, 6/17. I told pt that I would send the results to them so they can be able to have them.  Routing to [employer] so she can place order for the CT to be repeated 12/2019.

## 2018-12-26 NOTE — Telephone Encounter (Signed)
Order placed for 1 year f/u low dose chest ct.  Nothing further needed at this time.

## 2019-01-31 ENCOUNTER — Ambulatory Visit: Payer: BLUE CROSS/BLUE SHIELD | Admitting: Emergency Medicine

## 2019-04-01 DIAGNOSIS — H6522 Chronic serous otitis media, left ear: Secondary | ICD-10-CM | POA: Insufficient documentation

## 2019-04-01 DIAGNOSIS — H9012 Conductive hearing loss, unilateral, left ear, with unrestricted hearing on the contralateral side: Secondary | ICD-10-CM | POA: Insufficient documentation

## 2019-08-13 DIAGNOSIS — Z9622 Myringotomy tube(s) status: Secondary | ICD-10-CM | POA: Insufficient documentation

## 2019-09-23 ENCOUNTER — Other Ambulatory Visit: Payer: Self-pay | Admitting: Gastroenterology

## 2019-09-30 ENCOUNTER — Other Ambulatory Visit (HOSPITAL_COMMUNITY)
Admission: RE | Admit: 2019-09-30 | Discharge: 2019-09-30 | Disposition: A | Discharge status: Home / Self Care | Payer: Medicare Other | Source: Ambulatory Visit | Attending: Gastroenterology | Admitting: Gastroenterology

## 2019-09-30 DIAGNOSIS — Z1211 Encounter for screening for malignant neoplasm of colon: Secondary | ICD-10-CM | POA: Diagnosis not present

## 2019-09-30 DIAGNOSIS — Z01812 Encounter for preprocedural laboratory examination: Secondary | ICD-10-CM | POA: Insufficient documentation

## 2019-09-30 DIAGNOSIS — D123 Benign neoplasm of transverse colon: Secondary | ICD-10-CM | POA: Diagnosis not present

## 2019-09-30 DIAGNOSIS — F17200 Nicotine dependence, unspecified, uncomplicated: Secondary | ICD-10-CM | POA: Diagnosis not present

## 2019-09-30 DIAGNOSIS — F419 Anxiety disorder, unspecified: Secondary | ICD-10-CM | POA: Diagnosis not present

## 2019-09-30 DIAGNOSIS — M1990 Unspecified osteoarthritis, unspecified site: Secondary | ICD-10-CM | POA: Diagnosis not present

## 2019-09-30 DIAGNOSIS — D124 Benign neoplasm of descending colon: Secondary | ICD-10-CM | POA: Diagnosis not present

## 2019-09-30 DIAGNOSIS — Z86010 Personal history of colonic polyps: Secondary | ICD-10-CM | POA: Diagnosis present

## 2019-09-30 DIAGNOSIS — Z20822 Contact with and (suspected) exposure to covid-19: Secondary | ICD-10-CM | POA: Insufficient documentation

## 2019-09-30 DIAGNOSIS — K644 Residual hemorrhoidal skin tags: Secondary | ICD-10-CM | POA: Diagnosis not present

## 2019-09-30 DIAGNOSIS — K648 Other hemorrhoids: Secondary | ICD-10-CM | POA: Diagnosis not present

## 2019-09-30 DIAGNOSIS — K5730 Diverticulosis of large intestine without perforation or abscess without bleeding: Secondary | ICD-10-CM | POA: Diagnosis not present

## 2019-09-30 DIAGNOSIS — K5909 Other constipation: Secondary | ICD-10-CM | POA: Diagnosis not present

## 2019-09-30 DIAGNOSIS — D125 Benign neoplasm of sigmoid colon: Secondary | ICD-10-CM | POA: Diagnosis not present

## 2019-09-30 DIAGNOSIS — J449 Chronic obstructive pulmonary disease, unspecified: Secondary | ICD-10-CM | POA: Diagnosis not present

## 2019-09-30 DIAGNOSIS — Z888 Allergy status to other drugs, medicaments and biological substances status: Secondary | ICD-10-CM | POA: Diagnosis not present

## 2019-09-30 DIAGNOSIS — F329 Major depressive disorder, single episode, unspecified: Secondary | ICD-10-CM | POA: Diagnosis not present

## 2019-09-30 DIAGNOSIS — Z7951 Long term (current) use of inhaled steroids: Secondary | ICD-10-CM | POA: Diagnosis not present

## 2019-09-30 DIAGNOSIS — Z79899 Other long term (current) drug therapy: Secondary | ICD-10-CM | POA: Diagnosis not present

## 2019-09-30 LAB — SARS CORONAVIRUS 2 (TAT 6-24 HRS): SARS Coronavirus 2: NEGATIVE

## 2019-10-03 ENCOUNTER — Ambulatory Visit (HOSPITAL_COMMUNITY): Payer: Medicare Other | Admitting: Anesthesiology

## 2019-10-03 ENCOUNTER — Encounter (HOSPITAL_COMMUNITY): Payer: Self-pay | Admitting: Gastroenterology

## 2019-10-03 ENCOUNTER — Other Ambulatory Visit: Payer: Self-pay

## 2019-10-03 ENCOUNTER — Ambulatory Visit (HOSPITAL_COMMUNITY)
Admission: RE | Admit: 2019-10-03 | Discharge: 2019-10-03 | Disposition: A | Discharge status: Home / Self Care | Payer: Medicare Other | Attending: Gastroenterology | Admitting: Gastroenterology

## 2019-10-03 ENCOUNTER — Encounter (HOSPITAL_COMMUNITY)
Admission: RE | Disposition: A | Discharge status: Home / Self Care | Payer: Self-pay | Source: Home / Self Care | Attending: Gastroenterology

## 2019-10-03 DIAGNOSIS — D123 Benign neoplasm of transverse colon: Secondary | ICD-10-CM | POA: Diagnosis not present

## 2019-10-03 DIAGNOSIS — F419 Anxiety disorder, unspecified: Secondary | ICD-10-CM | POA: Insufficient documentation

## 2019-10-03 DIAGNOSIS — Z20822 Contact with and (suspected) exposure to covid-19: Secondary | ICD-10-CM | POA: Insufficient documentation

## 2019-10-03 DIAGNOSIS — Z79899 Other long term (current) drug therapy: Secondary | ICD-10-CM | POA: Insufficient documentation

## 2019-10-03 DIAGNOSIS — K5730 Diverticulosis of large intestine without perforation or abscess without bleeding: Secondary | ICD-10-CM | POA: Insufficient documentation

## 2019-10-03 DIAGNOSIS — D124 Benign neoplasm of descending colon: Secondary | ICD-10-CM | POA: Diagnosis not present

## 2019-10-03 DIAGNOSIS — Z1211 Encounter for screening for malignant neoplasm of colon: Secondary | ICD-10-CM | POA: Diagnosis not present

## 2019-10-03 DIAGNOSIS — D125 Benign neoplasm of sigmoid colon: Secondary | ICD-10-CM | POA: Insufficient documentation

## 2019-10-03 DIAGNOSIS — K648 Other hemorrhoids: Secondary | ICD-10-CM | POA: Insufficient documentation

## 2019-10-03 DIAGNOSIS — Z86010 Personal history of colonic polyps: Secondary | ICD-10-CM | POA: Insufficient documentation

## 2019-10-03 DIAGNOSIS — M1990 Unspecified osteoarthritis, unspecified site: Secondary | ICD-10-CM | POA: Insufficient documentation

## 2019-10-03 DIAGNOSIS — K644 Residual hemorrhoidal skin tags: Secondary | ICD-10-CM | POA: Insufficient documentation

## 2019-10-03 DIAGNOSIS — J449 Chronic obstructive pulmonary disease, unspecified: Secondary | ICD-10-CM | POA: Insufficient documentation

## 2019-10-03 DIAGNOSIS — Z888 Allergy status to other drugs, medicaments and biological substances status: Secondary | ICD-10-CM | POA: Insufficient documentation

## 2019-10-03 DIAGNOSIS — Z7951 Long term (current) use of inhaled steroids: Secondary | ICD-10-CM | POA: Insufficient documentation

## 2019-10-03 DIAGNOSIS — F17200 Nicotine dependence, unspecified, uncomplicated: Secondary | ICD-10-CM | POA: Insufficient documentation

## 2019-10-03 DIAGNOSIS — F329 Major depressive disorder, single episode, unspecified: Secondary | ICD-10-CM | POA: Insufficient documentation

## 2019-10-03 DIAGNOSIS — K5909 Other constipation: Secondary | ICD-10-CM | POA: Insufficient documentation

## 2019-10-03 HISTORY — PX: COLONOSCOPY WITH PROPOFOL: SHX5780

## 2019-10-03 HISTORY — PX: POLYPECTOMY: SHX5525

## 2019-10-03 SURGERY — COLONOSCOPY WITH PROPOFOL
Anesthesia: Monitor Anesthesia Care

## 2019-10-03 MED ORDER — PROPOFOL 500 MG/50ML IV EMUL
INTRAVENOUS | Status: DC | PRN
  Administered 2019-10-03: 125 ug/kg/min via INTRAVENOUS

## 2019-10-03 MED ORDER — SODIUM CHLORIDE 0.9 % IV SOLN: INTRAVENOUS | Status: DC

## 2019-10-03 MED ORDER — LACTATED RINGERS IV SOLN: INTRAVENOUS | Status: DC | PRN

## 2019-10-03 MED ORDER — PROPOFOL 500 MG/50ML IV EMUL
INTRAVENOUS | Status: DC | PRN
  Administered 2019-10-03: 30 mg via INTRAVENOUS

## 2019-10-03 SURGICAL SUPPLY — 22 items

## 2019-10-03 NOTE — Anesthesia Postprocedure Evaluation (Signed)
Anesthesia Post Note  Patient: OCTAVION MOLLENKOPF  Procedure(s) Performed: COLONOSCOPY WITH PROPOFOL (N/A ) POLYPECTOMY     Patient location during evaluation: Endoscopy Anesthesia Type: MAC Level of consciousness: awake and alert Pain management: pain level controlled Vital Signs Assessment: post-procedure vital signs reviewed and stable Respiratory status: spontaneous breathing, nonlabored ventilation and respiratory function stable Cardiovascular status: blood pressure returned to baseline and stable Postop Assessment: no apparent nausea or vomiting Anesthetic complications: no    Last Vitals:  Vitals:   10/03/19 1242 10/03/19 1503  BP: 133/72 (!) 160/81  Pulse: 85 (!) 102  Resp: (!) 21 17  Temp: 36.7 C 36.5 C  SpO2: 97% 95%    Last Pain:  Vitals:   10/03/19 1510  TempSrc:   PainSc: 0-No pain                 Lynda Rainwater

## 2019-10-03 NOTE — Progress Notes (Signed)
Mitchell Dean 1:45 PM  Subjective: Patient without any new complaints since he was seen recently in our office  Objective: Vital signs stable afebrile exam please see preassessment evaluation  Assessment: Difficult to remove colon polyps  Plan: Okay to proceed with colonoscopy with anesthesia assistance  [facility]  office [phone] After 5PM or if no answer call [phone]

## 2019-10-03 NOTE — Anesthesia Preprocedure Evaluation (Signed)
Anesthesia Evaluation  Patient identified by MRN, date of birth, ID band Patient awake    Reviewed: Allergy & Precautions, NPO status , Patient's Chart, lab work & pertinent test results  Airway Mallampati: II  TM Distance: >3 FB     Dental   Pulmonary shortness of breath, COPD, Current Smoker,    breath sounds clear to auscultation       Cardiovascular negative cardio ROS   Rhythm:Regular Rate:Normal     Neuro/Psych negative neurological ROS     GI/Hepatic Neg liver ROS, GERD  ,  Endo/Other  negative endocrine ROS  Renal/GU negative Renal ROS     Musculoskeletal  (+) Arthritis ,   Abdominal   Peds  Hematology negative hematology ROS (+)   Anesthesia Other Findings   Reproductive/Obstetrics                             Anesthesia Physical Anesthesia Plan  ASA: III  Anesthesia Plan: MAC   Post-op Pain Management:    Induction:   PONV Risk Score and Plan: 0 and Propofol infusion, Ondansetron and Treatment may vary due to age or medical condition  Airway Management Planned: Natural Airway and Simple Face Mask  Additional Equipment:   Intra-op Plan:   Post-operative Plan:   Informed Consent: I have reviewed the patients History and Physical, chart, labs and discussed the procedure including the risks, benefits and alternatives for the proposed anesthesia with the patient or authorized representative who has indicated his/her understanding and acceptance.       Plan Discussed with:   Anesthesia Plan Comments:         Anesthesia Quick Evaluation

## 2019-10-03 NOTE — Op Note (Signed)
[facility] Patient Name: Mitchell Dean Procedure Date: 10/03/2019 MRN: [id number] Attending MD: Clarene Essex , MD Date of Birth: 1956/07/09 CSN: [id number] Age: 64 Admit Type: Outpatient Procedure:                Colonoscopy Indications:              High risk colon cancer surveillance: Personal                            history of colonic polyps, Last colonoscopy:                            December 2019 Providers:                Clarene Essex, MD, Benay Pillow, RN, Janeece Agee,                            Technician Referring MD:              Medicines:                Propofol total dose [id number] mg IV Complications:            No immediate complications. Estimated Blood Loss:     Estimated blood loss: none. Procedure:                Pre-Anesthesia Assessment:                           - Prior to the procedure, a History and Physical                            was performed, and patient medications and                            allergies were reviewed. The patient's tolerance of                            previous anesthesia was also reviewed. The risks                            and benefits of the procedure and the sedation                            options and risks were discussed with the patient.                            All questions were answered, and informed consent                            was obtained. Prior Anticoagulants: The patient has                            taken no previous anticoagulant or antiplatelet                            agents. ASA Grade Assessment:  III - A patient with                            severe systemic disease. After reviewing the risks                            and benefits, the patient was deemed in                            satisfactory condition to undergo the procedure.                           After obtaining informed consent, the colonoscope                            was passed under direct vision. Throughout the                          procedure, the patient's blood pressure, pulse, and                            oxygen saturations were monitored continuously. The                            [id number]) Olympus colonoscope was                            introduced through the anus and advanced to the the                            cecum, identified by appendiceal orifice and                            ileocecal valve. The colonoscopy was somewhat                            difficult due to unsatisfactory bowel prep and                            difficulty holding air and increased spasm.                            Successful completion of the procedure was aided by                            lavage. The patient tolerated the procedure well.                            The quality of the bowel preparation was fair. Scope In: 2:05:11 PM Scope Out: 2:56:10 PM Scope Withdrawal Time: 0 hours 47 minutes 26 seconds  Total Procedure Duration: 0 hours 50 minutes 59 seconds  Findings:      External and internal hemorrhoids were found during retroflexion, during       perianal exam and during digital exam. The hemorrhoids were  medium-sized.      Scattered small-mouthed diverticula were found in the sigmoid colon.      A medium residual polyp was found in the proximal transverse colon next       to the tattoo. The polyp was semi-sessile. The polyp was removed with a       piecemeal technique using a hot and cold snare. Resection and retrieval       were complete.      Five semi-sessile polyps were found in the sigmoid colon, descending       colon and splenic flexure. The polyps were small in size. These polyps       were removed with 3 hot snares and 2 cold snares. Resection and       retrieval were complete.      The exam was otherwise without abnormality.      Solid stool was found at the hepatic flexure, in the ascending colon and       in the cecum, interfering with visualization. Impression:                - Preparation of the colon was fair.                           - External and internal hemorrhoids.                           - Diverticulosis in the sigmoid colon.                           - One medium polyp in the proximal transverse                            colon, removed piecemeal using a hot snare.                            Resected and retrieved.                           - Five small polyps in the sigmoid colon, in the                            descending colon and at the splenic flexure,                            removed with a hot snare. Resected and retrieved.                           - The examination was otherwise normal.                           - Stool at the hepatic flexure, in the ascending                            colon and in the cecum. Moderate Sedation:      Not Applicable - Patient had care per Anesthesia. Recommendation:           - Patient has a contact number available for  emergencies. The signs and symptoms of potential                            delayed complications were discussed with the                            patient. Return to normal activities tomorrow.                            Written discharge instructions were provided to the                            patient.                           - Soft diet today.                           - Continue present medications.                           - Await pathology results.                           - Repeat colonoscopy in 2-3 years for surveillance                            based on pathology results. With increased prep and                            increased clear liquid diet                           - Return to GI office PRN.                           - Telephone GI clinic for pathology results in 1                            week.                           - Telephone GI clinic if symptomatic PRN. Procedure Code(s):        --- Professional ---                            [phone], Colonoscopy, flexible; with removal of                            tumor(s), polyp(s), or other lesion(s) by snare                            technique Diagnosis Code(s):        --- Professional ---                           K63.5, Polyp  of colon                           Z86.010, Personal history of colonic polyps                           K57.30, Diverticulosis of large intestine without                            perforation or abscess without bleeding CPT copyright 2019 American [employer] Association. All rights reserved. The codes documented in this report are preliminary and upon coder review may  be revised to meet current compliance requirements. Clarene Essex, MD 10/03/2019 3:09:27 PM This report has been signed electronically. Number of Addenda: 0

## 2019-10-03 NOTE — Discharge Instructions (Addendum)
YOU HAD AN ENDOSCOPIC PROCEDURE TODAY: Refer to the procedure report and other information in the discharge instructions given to you for any specific questions about what was found during the examination. If this information does not answer your questions, please call [facility] office at [phone] to clarify.   YOU SHOULD EXPECT: Some feelings of bloating in the abdomen. Passage of more gas than usual. Walking can help get rid of the air that was put into your GI tract during the procedure and reduce the bloating. If you had a lower endoscopy (such as a colonoscopy or flexible sigmoidoscopy) you may notice spotting of blood in your stool or on the toilet paper. Some abdominal soreness may be present for a day or two, also.  DIET: Your first meal following the procedure should be a light meal and then it is ok to progress to your normal diet. A half-sandwich or bowl of soup is an example of a good first meal. Heavy or fried foods are harder to digest and may make you feel nauseous or bloated. Drink plenty of fluids but you should avoid alcoholic beverages for 24 hours. If you had a esophageal dilation, please see attached instructions for diet.    ACTIVITY: Your care partner should take you home directly after the procedure. You should plan to take it easy, moving slowly for the rest of the day. You can resume normal activity the day after the procedure however YOU SHOULD NOT DRIVE, use power tools, machinery or perform tasks that involve climbing or major physical exertion for 24 hours (because of the sedation medicines used during the test).   SYMPTOMS TO REPORT IMMEDIATELY: A gastroenterologist can be reached at any hour. Please call [phone]  for any of the following symptoms:   Following lower endoscopy (colonoscopy, flexible sigmoidoscopy) Excessive amounts of blood in the stool  Significant tenderness, worsening of abdominal pains  Swelling of the abdomen that is new, acute  Fever of 100  or higher   Following upper endoscopy (EGD, EUS, ERCP, esophageal dilation) Vomiting of blood or coffee ground material  New, significant abdominal pain  New, significant chest pain or pain under the shoulder blades  Painful or persistently difficult swallowing  New shortness of breath  Black, tarry-looking or red, bloody stools  FOLLOW UP:  If any biopsies were taken you will be contacted by phone or by letter within the next 1-3 weeks. Call [phone]  if you have not heard about the biopsies in 3 weeks.  Please also call with any specific questions about appointments or follow up tests. Soft solids today and call if question or problem or significant pain fever or signs of significant GI bleeding otherwise call for biopsy report in 1 week and probable repeat colonoscopy in 2 to 3 years with increased prep but can be done in our office

## 2019-10-03 NOTE — Transfer of Care (Signed)
Immediate Anesthesia Transfer of Care Note  Patient: Mitchell Dean  Procedure(s) Performed: COLONOSCOPY WITH PROPOFOL (N/A ) POLYPECTOMY  Patient Location: PACU  Anesthesia Type:MAC  Level of Consciousness: sedated, patient cooperative and responds to stimulation  Airway & Oxygen Therapy: Patient Spontanous Breathing and Patient connected to face mask oxygen  Post-op Assessment: Report given to RN and Post -op Vital signs reviewed and stable  Post vital signs: Reviewed and stable  Last Vitals:  Vitals Value Taken Time  BP 160/81 10/03/19 1502  Temp    Pulse 103 10/03/19 1503  Resp 20 10/03/19 1503  SpO2 95 % 10/03/19 1503  Vitals shown include unvalidated device data.  Last Pain:  Vitals:   10/03/19 1503  TempSrc: (P) Axillary  PainSc:          Complications: No apparent anesthesia complications

## 2019-10-04 LAB — SURGICAL PATHOLOGY

## 2019-10-07 ENCOUNTER — Encounter: Payer: Self-pay | Admitting: *Deleted

## 2019-10-19 ENCOUNTER — Ambulatory Visit: Payer: Medicare Other | Attending: Internal Medicine

## 2019-10-19 DIAGNOSIS — Z23 Encounter for immunization: Secondary | ICD-10-CM

## 2019-10-19 NOTE — Progress Notes (Signed)
   Covid-19 Vaccination Clinic  Name:  Mitchell Dean    MRN: [id number] DOB: Dec 10, 1955  10/19/2019  Mr. Mitchell Dean was observed post Covid-19 immunization for 15 minutes without incident. He was provided with Vaccine Information Sheet and instruction to access the V-Safe system.   Mr. Mitchell Dean was instructed to call 911 with any severe reactions post vaccine: Marland Kitchen Difficulty breathing  . Swelling of face and throat  . A fast heartbeat  . A bad rash all over body  . Dizziness and weakness   Immunizations Administered    Name Date Dose VIS Date Route   Pfizer COVID-19 Vaccine 10/19/2019  8:56 AM 0.3 mL 06/21/2019 Intramuscular   Manufacturer: [place]   Lot: [id number]   [place]: [id number]

## 2019-11-11 ENCOUNTER — Ambulatory Visit: Payer: Medicare Other | Attending: Internal Medicine

## 2019-11-11 DIAGNOSIS — Z23 Encounter for immunization: Secondary | ICD-10-CM

## 2019-11-11 NOTE — Progress Notes (Signed)
   Covid-19 Vaccination Clinic  Name:  Mitchell Dean    MRN: [id number] DOB: 25-Jan-1956  11/11/2019  [name] was observed post Covid-19 immunization for 15 minutes without incident. He was provided with Vaccine Information Sheet and instruction to access the V-Safe system.   [name] was instructed to call 911 with any severe reactions post vaccine: Marland Kitchen Difficulty breathing  . Swelling of face and throat  . A fast heartbeat  . A bad rash all over body  . Dizziness and weakness   Immunizations Administered    Name Date Dose VIS Date Route   Pfizer COVID-19 Vaccine 11/11/2019 12:04 PM 0.3 mL 09/04/2018 Intramuscular   Manufacturer: [place]   Lot: [id number]   [place]: [id number]

## 2020-01-28 ENCOUNTER — Other Ambulatory Visit: Payer: Self-pay

## 2020-01-28 ENCOUNTER — Encounter: Payer: Self-pay | Admitting: Pulmonary Disease

## 2020-01-28 ENCOUNTER — Ambulatory Visit: Payer: Medicare Other | Admitting: Pulmonary Disease

## 2020-01-28 VITALS — BP 132/80 | HR 82 | Temp 98.2°F | Ht 67.5 in | Wt 191.2 lb

## 2020-01-28 DIAGNOSIS — J449 Chronic obstructive pulmonary disease, unspecified: Secondary | ICD-10-CM

## 2020-01-28 DIAGNOSIS — F17210 Nicotine dependence, cigarettes, uncomplicated: Secondary | ICD-10-CM | POA: Diagnosis not present

## 2020-01-28 DIAGNOSIS — F17200 Nicotine dependence, unspecified, uncomplicated: Secondary | ICD-10-CM

## 2020-01-28 MED ORDER — NICOTINE POLACRILEX 4 MG MT LOZG: 4.0000 mg | LOZENGE | OROMUCOSAL | 3 refills | Status: DC | PRN

## 2020-01-28 NOTE — Patient Instructions (Addendum)
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  Dispense: 108 tablet; Refill: 3  Trelegy Ellipta  >>> 1 puff daily in the morning >>>rinse mouth out after use  >>> This inhaler contains 3 medications that help manage her respiratory status, contact our office if you cannot afford this medication or unable to remain on this medication  Only use your albuterol as a rescue medication to be used if you can't catch your breath by resting or doing a relaxed purse lip breathing pattern.  - The less you use it, the better it will work when you need it. - Ok to use up to 2 puffs  every 4 hours if you must but call for immediate appointment if use goes up over your usual need - Don't leave home without it !!  (think of it like the spare tire for your car)   Note your daily symptoms > remember "red flags" for COPD:   >>>Increase in cough >>>increase in sputum production >>>increase in shortness of breath or activity  intolerance.   If you notice these symptoms, please call the office to be seen.   We will repeat breathing tests on you in 3 months   2. Tobacco use disorder  - nicotine polacrilex (COMMIT) 4 MG lozenge; Take 1 lozenge (4 mg total) by mouth as needed for smoking cessation.  Dispense: 108 tablet; Refill: 3  We recommend that you stop smoking.  >>>You need to set a quit date >>>If you have friends or family who smoke, let them know you are trying to quit and not to smoke around you or in your living environment  Smoking Cessation Resources:  1 800 QUIT NOW  >>> Patient to call this resource and utilize it to help support her quit smoking >>> Keep up your hard work with stopping smoking  You can also contact the [facility] >>>For smoking cessation classes call [phone]  We do not recommend using e-cigarettes as a form  of stopping smoking  You can sign up for smoking cessation support texts and information:  >>>https://smokefree.gov/smokefreetxt   Nicotine patches: >>>Make sure you rotate sites that you do not get skin irritation, Apply 1 patch each morning to a non-hairy skin site  If you are smoking greater than 10 cigarettes/day and weigh over 45 kg start with the nicotine patch of 21 mg a day for 6 weeks, then 14 mg a day for 2 weeks, then finished with 7 mg a day for 2 weeks, then stop  If you are smoking less than 10 cigarettes a day or weight less than 45 kg start with medium dose pack of 14 mg a day for 6 weeks, followed by 7 mg a day for 2 weeks   >>>If insomnia occurs you are having trouble sleeping you can take the patch off at night, and place a new one on in the morning >>>If the patch is removed at night and you have morning cravings start short acting nicotine replacement therapy such as gum or lozenges   Nicotine lozenge: Lozenges are commonly uses short acting NRT product  >>>Smokers who smoke within 30 minutes of awakening should use 4 mg dose >>>Smokers who wait more than 30 minutes after awakening to smoke should use 2 mg dose  Can use up to 1 lozenge  every 1-2 hours for 6 weeks >>>Total amount of lozenges that can be used per day as 20 >>>Gradually reduce number of lozenges used per day after 2 weeks of use  Place lozenge in mouth and allowed to dissolve for 30 minutes loss and does not need to be chewed  Lozenges have advantages to be able to be used in people with TMG, poor dentition, dentures   I will contact our Lung Cancer Screening team to get your CT scheduled    We recommend today:  No orders of the defined types were placed in this encounter.  No orders of the defined types were placed in this encounter.  Meds ordered this encounter  Medications  . nicotine polacrilex (COMMIT) 4 MG lozenge    Sig: Take 1 lozenge (4 mg total) by mouth as needed for smoking  cessation.    Dispense:  108 tablet    Refill:  3    Follow Up:    Return in about 3 months (around 04/29/2020), or if symptoms worsen or fail to improve, for Follow up for FULL PFT - 60 min, NEW PULMONOLOGIST IN 12min SLOT.   Please do your part to reduce the spread of COVID-19:      Reduce your risk of any infection  and COVID19 by using the similar precautions used for avoiding the common cold or flu:  Marland Kitchen Wash your hands often with soap and warm water for at least 20 seconds.  If soap and water are not readily available, use an alcohol-based hand sanitizer with at least 60% alcohol.  . If coughing or sneezing, cover your mouth and nose by coughing or sneezing into the elbow areas of your shirt or coat, into a tissue or into your sleeve (not your hands). Langley Gauss A MASK when in public  . Avoid shaking hands with others and consider head nods or verbal greetings only. . Avoid touching your eyes, nose, or mouth with unwashed hands.  . Avoid close contact with people who are sick. . Avoid places or events with large numbers of people in one location, like concerts or sporting events. . If you have some symptoms but not all symptoms, continue to monitor at home and seek medical attention if your symptoms worsen. . If you are having a medical emergency, call 911.   [place] / e-Visit: [email or web address]         [facility]: [facility]: [phone]                   [facility]: [phone]     It is flu season:   >>> Best ways to protect herself from the flu: Receive the yearly flu vaccine, practice good hand hygiene washing with soap and also using hand sanitizer when available, eat a nutritious meals, get adequate rest, hydrate appropriately   Please contact the office if your symptoms worsen or you have concerns that you  are not improving.   Thank you for choosing [facility] for your healthcare, and for allowing [place] to partner with you on your healthcare journey. I am thankful to be able to provide care to you today.   Wyn Quaker FNP-C    Health Risks of Smoking Smoking cigarettes is very bad for your health. Tobacco smoke has over 200 known poisons in it. It contains the poisonous gases nitrogen oxide and carbon monoxide. There are over 60  chemicals in tobacco smoke that cause cancer. Smoking is difficult to quit because a chemical in tobacco, called nicotine, causes addiction or dependence. When you smoke and inhale, nicotine is absorbed rapidly into the bloodstream through your lungs. Both inhaled and non-inhaled nicotine may be addictive. What are the risks of cigarette smoke? Cigarette smokers have an increased risk of many serious medical problems, including:  Lung cancer.  Lung disease, such as pneumonia, bronchitis, and emphysema.  Chest pain (angina) and heart attack because the heart is not getting enough oxygen.  Heart disease and peripheral blood vessel disease.  High blood pressure (hypertension).  Stroke.  Oral cancer, including cancer of the lip, mouth, or voice box.  Bladder cancer.  Pancreatic cancer.  Cervical cancer.  Pregnancy complications, including premature birth.  Stillbirths and smaller newborn babies, birth defects, and genetic damage to sperm.  Early menopause.  Lower estrogen level for women.  Infertility.  Facial wrinkles.  Blindness.  Increased risk of broken bones (fractures).  Senile dementia.  Stomach ulcers and internal bleeding.  Delayed wound healing and increased risk of complications during surgery.  Even smoking lightly shortens your life expectancy by several years. Because of secondhand smoke exposure, children of smokers have an increased risk of the following:  Sudden infant death syndrome (SIDS).  Respiratory  infections.  Lung cancer.  Heart disease.  Ear infections. What are the benefits of quitting? There are many health benefits of quitting smoking. Here are some of them:  Within days of quitting smoking, your risk of having a heart attack decreases, your blood flow improves, and your lung capacity improves. Blood pressure, pulse rate, and breathing patterns start returning to normal soon after quitting.  Within months, your lungs may clear up completely.  Quitting for 10 years reduces your risk of developing lung cancer and heart disease to almost that of a nonsmoker.  People who quit may see an improvement in their overall quality of life. How do I quit smoking?     Smoking is an addiction with both physical and psychological effects, and longtime habits can be hard to change. Your health care provider can recommend:  Programs and community resources, which may include group support, education, or talk therapy.  Prescription medicines to help reduce cravings.  Nicotine replacement products, such as patches, gum, and nasal sprays. Use these products only as directed. Do not replace cigarette smoking with electronic cigarettes, which are commonly called e-cigarettes. The safety of e-cigarettes is not known, and some may contain harmful chemicals.  A combination of two or more of these methods. Where to find more information  American Lung Association: www.lung.org  American Cancer Society: www.cancer.org Summary  Smoking cigarettes is very bad for your health. Cigarette smokers have an increased risk of many serious medical problems, including several cancers, heart disease, and stroke.  Smoking is an addiction with both physical and psychological effects, and longtime habits can be hard to change.  By stopping right away, you can greatly reduce the risk of medical problems for you and your family.  To help you quit smoking, your health care provider can recommend programs,  community resources, prescription medicines, and nicotine replacement products such as patches, gum, and nasal sprays. This information is not intended to replace advice given to you by your health care provider. Make sure you discuss any questions you have with your health care provider. Document Revised: 09/28/2017 Document Reviewed: 07/01/2016 Elsevier Patient Education  2020 [employer].

## 2020-01-28 NOTE — Assessment & Plan Note (Signed)
Assessment: March/2018 PFT shows an FEV1 of 2.26 (71% predicted), positive bronchodilator response, DLCO corrects to 66 Managed on Symbicort 160 Using rescue inhaler 1-3 times daily Current every day smoker mMRC 3 today  Plan: Continue Trelegy Ellipta You need to stop smoking We will order pulmonary function testing to compare to 2018 4 mg nicotine lozenges prescribed today Patient needs to establish with new pulmonologist in 3 months with pulmonary function testing and a 30-minute time slot

## 2020-01-28 NOTE — Assessment & Plan Note (Signed)
Assessment: Current every day smoker 67.5-pack-year smoking history Smoking 4 to 6 cigarettes a day Over the last year he is decreased his consumption by about 50 to 60% Congratulated patient on his progress Lung RADS 1 in June/2020  Plan: You need to stop smoking You need to set a quit date Continue 14 mg nicotine patches Prescription for 4 mg nicotine lozenges sent We will coordinate getting the patient reschedule with the lung cancer screening program as he is due for lung cancer screening CT

## 2020-01-28 NOTE — Progress Notes (Signed)
Reviewed and agree with assessment/plan.   Chesley Mires, MD [facility]monary/Critical Care 01/28/2020, 12:02 PM Pager:  [phone]

## 2020-01-28 NOTE — Progress Notes (Signed)
@Patient  ID: Mitchell Dean, male    DOB: [birth year], 65 y.o.   MRN: [id number]  Chief Complaint  Patient presents with  . COPD    Referring provider: Carol Ada, MD  HPI:  64 year old male current smoker followed in out office for COPD   PMH: depression  Smoker/ Smoking History: Current Smoker. 4 cigarettes a day.  Maintenance:  Trelegy Ellipta  [name]: Needs Outpt Pulmonologist   01/28/2020  - Visit   64 year old male current everyday smoker followed in our office for COPD.  Previously followed by Dr. Ashok Cordia.  Last 2 appointments with our office were back in 2020 with APP's.  Patient also completed a shared decision making visit.  Those results are listed below:  12/19/2018-CT chest lung cancer screening-lung RADS 1, negative, aortic arthrosclerosis, centrilobular emphysema  Plans are to repeat the CT again in June/2021.  He does not show this is been completed yet we will follow up with the patient today regarding this.  Patient needs an outpatient pulmonologist with our office.  Patient is still smoking around 4 cigarettes a day.  He reports adherence to Trelegy Ellipta.  Questionaires / Pulmonary Flowsheets:   ACT:  No flowsheet data found.  MMRC: mMRC Dyspnea Scale mMRC Score  01/28/2020 1    Epworth:  No flowsheet data found.  Tests:   PFT 12/30/16: FVC 3.02 L (70%) FEV1 2.01 L (62%) FEV1/FVC 0.66 FEF 25-75 1.03 L (38%) negative bronchodilator response 09/28/16: FVC 3.12 L (73%) FEV1 2.26 L (71%) FEV1/FVC 0.73 FEF 25-75 1.58 L (60%) positive bronchodilator response TLC 6.64 L (103%) RV 155% ERV 43% DLCO corrected 66%  6MWT 09/28/16:  Walked 240 meters / Baseline Sat 97% on RA / Nadir Sat 98% on RA (stopped w/ 20 seconds remaining due to back pain)  IMAGING CXR PA/LAT 08/03/16 (previously reviewed by me): Hyperinflation with flattening of the diaphragms. No focal parenchymal opacity or mass appreciated. Heart normal in size & mediastinum normal in contour. No  pleural effusion.  LABS 09/28/16 Alpha-1 antitrypsin: MM (137)  08/04/16 ABG on RA:  7.416 / 31.3 / 58.1   FENO:  No results found for: NITRICOXIDE  PFT: PFT Results Latest Ref Rng & Units 12/30/2016 09/28/2016  FVC-Pre L 3.02 3.12  FVC-Predicted Pre % 70 73  FVC-Post L 3.21 3.52  FVC-Predicted Post % 74 83  Pre FEV1/FVC % % 66 73  Post FEV1/FCV % % 64 69  FEV1-Pre L 2.01 2.26  FEV1-Predicted Pre % 62 71  FEV1-Post L 2.07 2.42  DLCO UNC% % - 68  DLCO COR %Predicted % - 70  TLC L - 6.64  TLC % Predicted % - 103  RV % Predicted % - 155    WALK:  SIX MIN WALK 09/28/2016  Medications prozac, chantix, nicotine patch  Supplimental Oxygen during Test? (L/min) No  Laps 5  Partial Lap (in Meters) 0  Baseline BP (sitting) 114/80  Baseline Heartrate 94  Baseline Dyspnea (Borg Scale) 2  Baseline Fatigue (Borg Scale) 4  Baseline SPO2 97  BP (sitting) 122/68  Heartrate 97  Dyspnea (Borg Scale) 5  Fatigue (Borg Scale) 10  SPO2 98  BP (sitting) 110/70  Heartrate 87  SPO2 98  Stopped or Paused before Six Minutes Yes  Other Symptoms at end of Exercise Stopped with 20 seconds remaining due to back pain  Interpretation Hip pain  Distance Completed 240  Tech Comments: pt c/o back and hip pain and wore a back  brace during the walk. He c/o feeling lightheaded also.    Imaging: No results found.  Lab Results:  CBC    Component Value Date/Time   WBC 14.0 (H) 10/16/2017 0853   RBC 5.39 10/16/2017 0853   HGB 16.8 10/16/2017 0853   HGB 16.8 04/17/2017 0901   HCT 48.6 10/16/2017 0853   HCT 48.6 04/17/2017 0901   PLT 190 10/16/2017 0853   PLT 186 04/17/2017 0901   MCV 90.2 10/16/2017 0853   MCV 92.2 04/17/2017 0901   MCH 31.2 10/16/2017 0853   MCHC 34.6 10/16/2017 0853   RDW 15.5 (H) 10/16/2017 0853   RDW 14.1 04/17/2017 0901   LYMPHSABS 2.5 10/16/2017 0853   LYMPHSABS 2.4 04/17/2017 0901   MONOABS 0.9 10/16/2017 0853   MONOABS 0.8 04/17/2017 0901   EOSABS 0.0  10/16/2017 0853   EOSABS 0.1 04/17/2017 0901   BASOSABS 0.0 10/16/2017 0853   BASOSABS 0.0 04/17/2017 0901    BMET    Component Value Date/Time   NA 137 10/16/2017 0853   NA 138 02/10/2017 1150   K 3.7 10/16/2017 0853   K 4.1 02/10/2017 1150   CL 104 10/16/2017 0853   CO2 25 10/16/2017 0853   CO2 23 02/10/2017 1150   GLUCOSE 131 10/16/2017 0853   GLUCOSE 104 02/10/2017 1150   BUN 24 10/16/2017 0853   BUN 14.3 02/10/2017 1150   CREATININE 1.10 10/16/2017 0853   CREATININE 1.2 02/10/2017 1150   CALCIUM 9.4 10/16/2017 0853   CALCIUM 9.6 02/10/2017 1150   GFRNONAA >60 10/16/2017 0853   GFRAA >60 10/16/2017 0853    BNP    Component Value Date/Time   BNP 27.9 08/03/2016 2316    ProBNP No results found for: PROBNP  Specialty Problems      Pulmonary Problems   Cough   Dyspnea   COPD mixed type ([place])      Allergies  Allergen Reactions  . Contrast Media [Iodinated Diagnostic Agents] Swelling    Eye swelling  . Iodine Swelling    Immunization History  Administered Date(s) Administered  . PFIZER SARS-COV-2 Vaccination 10/19/2019, 11/11/2019    Past Medical History:  Diagnosis Date  . Arthritis    hands  . Constipation   . COPD (chronic obstructive pulmonary disease) ([place])   . Depression   . Mild acid reflux    Minimal  . Prolapsed internal hemorrhoids     Tobacco History: Social History   Tobacco Use  Smoking Status Current Every Day Smoker  . Packs/day: 1.50  . Years: 45.00  . Pack years: 67.50  . Types: Cigarettes  . Start date: 02/06/1971  Smokeless Tobacco Never Used  Tobacco Comment   4-6 cigs per day as of 12/19/18   Ready to quit: No Counseling given: Yes Comment: 4-6 cigs per day as of 12/19/18  Smoking assessment and cessation counseling  Patient currently smoking: 4 cigarettes a day I have advised the patient to quit/stop smoking as soon as possible due to high risk for multiple medical problems.  It will also be very difficult for  [place] to manage patient's  respiratory symptoms and status if we continue to expose her lungs to a known irritant.  We do not advise e-cigarettes as a form of stopping smoking.  Patient is willing to quit smoking.  Has not set a quit date yet.  Patient is working at his own pace to decrease cigarette consumption.  He is currently at 4 cigarettes a day.  He is using  a 14 mg nicotine patch.  He is open to using lozenges in addition to the patch to help with decreasing smoking.  I have encouraged the patient to set a quit date.  Emphasized importance the patient that we get him rescheduled with a lung cancer screening program.  I have advised the patient that we can assist and have options of nicotine replacement therapy, provided smoking cessation education today, provided smoking cessation counseling, and provided cessation resources.  Follow-up next office visit office visit for assessment of smoking cessation.    Smoking cessation counseling advised for: 7 min     Outpatient Encounter Medications as of 01/28/2020  Medication Sig  . albuterol (PROVENTIL HFA;VENTOLIN HFA) 108 (90 Base) MCG/ACT inhaler Inhale 2 puffs into the lungs every 4 (four) hours as needed for wheezing or shortness of breath.  . ALPRAZolam (XANAX) 1 MG tablet Take 1 mg by mouth 3 (three) times daily as needed for anxiety.  Marland Kitchen FLUoxetine (PROZAC) 20 MG capsule Take 20 mg by mouth daily.  . Fluticasone-Umeclidin-Vilant (TRELEGY ELLIPTA) 100-62.5-25 MCG/INH AEPB Inhale 1 puff into the lungs daily.  . Multiple Vitamin (MULTIVITAMIN) tablet Take 1 tablet by mouth daily.  Marland Kitchen zolpidem (AMBIEN) 10 MG tablet Take 10 mg by mouth at bedtime.  . nicotine polacrilex (COMMIT) 4 MG lozenge Take 1 lozenge (4 mg total) by mouth as needed for smoking cessation.   No facility-administered encounter medications on file as of 01/28/2020.     Review of Systems  Review of Systems  Constitutional: Negative for activity change, chills, fatigue,  fever and unexpected weight change.  HENT: Negative for postnasal drip, rhinorrhea, sinus pressure, sinus pain and sore throat.   Eyes: Negative.   Respiratory: Negative for cough, shortness of breath and wheezing.   Cardiovascular: Negative for chest pain and palpitations.  Gastrointestinal: Negative for constipation, diarrhea, nausea and vomiting.  Endocrine: Negative.   Genitourinary: Negative.   Musculoskeletal: Negative.   Skin: Negative.   Neurological: Negative for dizziness and headaches.  Psychiatric/Behavioral: Negative.  Negative for dysphoric mood. The patient is not nervous/anxious.   All other systems reviewed and are negative.    Physical Exam  BP 132/80 (BP Location: Left Arm, Cuff Size: Normal)   Pulse 82   Temp 98.2 F (36.8 C) (Oral)   Ht 5' 7.5" (1.715 m)   Wt 191 lb 3.2 oz (86.7 kg)   SpO2 100%   BMI 29.50 kg/m   Wt Readings from Last 5 Encounters:  01/28/20 191 lb 3.2 oz (86.7 kg)  09/26/19 190 lb (86.2 kg)  12/19/18 195 lb (88.5 kg)  10/16/17 197 lb 11.2 oz (89.7 kg)  04/17/17 196 lb (88.9 kg)    BMI Readings from Last 5 Encounters:  01/28/20 29.50 kg/m  09/26/19 29.76 kg/m  12/19/18 30.54 kg/m  10/16/17 30.96 kg/m  04/17/17 30.70 kg/m     Physical Exam Vitals and nursing note reviewed.  Constitutional:      General: He is not in acute distress.    Appearance: Normal appearance. He is obese.  HENT:     Head: Normocephalic and atraumatic.     Right Ear: Hearing, tympanic membrane, ear canal and external ear normal. There is no impacted cerumen.     Left Ear: Hearing, tympanic membrane, ear canal and external ear normal. There is no impacted cerumen.     Ears:     Comments: Left tm with blue tube    Nose: Rhinorrhea present. No mucosal edema.  Right Turbinates: Not enlarged.     Left Turbinates: Not enlarged.     Mouth/Throat:     Mouth: Mucous membranes are dry.     Pharynx: Oropharynx is clear. No oropharyngeal exudate.    Eyes:     Pupils: Pupils are equal, round, and reactive to light.  Cardiovascular:     Rate and Rhythm: Normal rate and regular rhythm.     Pulses: Normal pulses.     Heart sounds: Normal heart sounds. No murmur heard.   Pulmonary:     Effort: Pulmonary effort is normal.     Breath sounds: Normal breath sounds. No decreased breath sounds, wheezing or rales.  Musculoskeletal:     Cervical back: Normal range of motion.     Right lower leg: No edema.     Left lower leg: No edema.  Lymphadenopathy:     Cervical: No cervical adenopathy.  Skin:    General: Skin is warm and dry.     Capillary Refill: Capillary refill takes less than 2 seconds.     Findings: No erythema or rash.  Neurological:     General: No focal deficit present.     Mental Status: He is alert and oriented to person, place, and time.     Motor: No weakness.     Coordination: Coordination normal.     Gait: Gait is intact. Gait normal.  Psychiatric:        Mood and Affect: Mood normal.        Behavior: Behavior normal. Behavior is cooperative.        Thought Content: Thought content normal.        Judgment: Judgment normal.       Assessment & Plan:   COPD mixed type [facility]) Assessment: March/2018 PFT shows an FEV1 of 2.26 (71% predicted), positive bronchodilator response, DLCO corrects to 66 Managed on Symbicort 160 Using rescue inhaler 1-3 times daily Current every day smoker mMRC 3 today  Plan: Continue Trelegy Ellipta You need to stop smoking We will order pulmonary function testing to compare to 2018 4 mg nicotine lozenges prescribed today Patient needs to establish with new pulmonologist in 3 months with pulmonary function testing and a 30-minute time slot   Tobacco use disorder Assessment: Current every day smoker 67.5-pack-year smoking history Smoking 4 to 6 cigarettes a day Over the last year he is decreased his consumption by about 50 to 60% Congratulated patient on his progress Lung RADS  1 in June/2020  Plan: You need to stop smoking You need to set a quit date Continue 14 mg nicotine patches Prescription for 4 mg nicotine lozenges sent We will coordinate getting the patient reschedule with the lung cancer screening program as he is due for lung cancer screening CT    Return in about 3 months (around 04/29/2020), or if symptoms worsen or fail to improve, for Follow up for FULL PFT - 60 min, NEW PULMONOLOGIST IN 59min SLOT.   Lauraine Rinne, NP 01/28/2020   This appointment required 32 minutes of patient care (this includes precharting, chart review, review of results, face-to-face care, etc.).

## 2020-02-07 ENCOUNTER — Ambulatory Visit: Payer: Medicare Other | Admitting: Pulmonary Disease

## 2020-02-12 ENCOUNTER — Ambulatory Visit (INDEPENDENT_AMBULATORY_CARE_PROVIDER_SITE_OTHER)
Admission: RE | Admit: 2020-02-12 | Discharge: 2020-02-12 | Disposition: A | Discharge status: Home / Self Care | Payer: Medicare Other | Source: Ambulatory Visit | Attending: Acute Care | Admitting: Acute Care

## 2020-02-12 ENCOUNTER — Other Ambulatory Visit: Payer: Self-pay

## 2020-02-12 DIAGNOSIS — F17210 Nicotine dependence, cigarettes, uncomplicated: Secondary | ICD-10-CM

## 2020-02-12 DIAGNOSIS — Z87891 Personal history of nicotine dependence: Secondary | ICD-10-CM

## 2020-02-12 DIAGNOSIS — Z122 Encounter for screening for malignant neoplasm of respiratory organs: Secondary | ICD-10-CM

## 2020-02-17 NOTE — Progress Notes (Signed)
Please call patient and let them  know their  low dose Ct was read as a Lung RADS 1, negative study: no nodules or definitely benign nodules. Radiology recommendation is for a repeat LDCT in 12 months. .Please let them  know we will order and schedule their  annual screening scan for 02/2021. . Please place order for annual  screening scan for  02/2021 and fax results to PCP. Thanks so much. °

## 2020-02-18 ENCOUNTER — Other Ambulatory Visit: Payer: Self-pay | Admitting: *Deleted

## 2020-02-18 DIAGNOSIS — F17210 Nicotine dependence, cigarettes, uncomplicated: Secondary | ICD-10-CM

## 2020-02-18 DIAGNOSIS — Z87891 Personal history of nicotine dependence: Secondary | ICD-10-CM

## 2020-04-29 ENCOUNTER — Ambulatory Visit: Payer: Medicare Other | Admitting: Pulmonary Disease

## 2020-04-29 ENCOUNTER — Other Ambulatory Visit: Payer: Self-pay

## 2020-04-29 ENCOUNTER — Encounter: Payer: Self-pay | Admitting: Pulmonary Disease

## 2020-04-29 ENCOUNTER — Ambulatory Visit (INDEPENDENT_AMBULATORY_CARE_PROVIDER_SITE_OTHER): Payer: Medicare Other | Admitting: Pulmonary Disease

## 2020-04-29 VITALS — BP 112/70 | HR 47 | Temp 97.8°F | Ht 67.5 in | Wt 184.0 lb

## 2020-04-29 DIAGNOSIS — J84115 Respiratory bronchiolitis interstitial lung disease: Secondary | ICD-10-CM | POA: Diagnosis not present

## 2020-04-29 DIAGNOSIS — J449 Chronic obstructive pulmonary disease, unspecified: Secondary | ICD-10-CM | POA: Diagnosis not present

## 2020-04-29 DIAGNOSIS — F17200 Nicotine dependence, unspecified, uncomplicated: Secondary | ICD-10-CM

## 2020-04-29 DIAGNOSIS — R0600 Dyspnea, unspecified: Secondary | ICD-10-CM

## 2020-04-29 DIAGNOSIS — R0609 Other forms of dyspnea: Secondary | ICD-10-CM

## 2020-04-29 LAB — PULMONARY FUNCTION TEST
DL/VA % pred: 83 %
DL/VA: 3.51 ml/min/mmHg/L
DLCO cor % pred: 85 %
DLCO cor: 21.11 ml/min/mmHg
DLCO unc % pred: 85 %
DLCO unc: 21.11 ml/min/mmHg
FEF 25-75 Post: 2.55 L/sec
FEF 25-75 Pre: 1.53 L/sec
FEF2575-%Change-Post: 66 %
FEF2575-%Pred-Post: 102 %
FEF2575-%Pred-Pre: 61 %
FEV1-%Change-Post: 12 %
FEV1-%Pred-Post: 77 %
FEV1-%Pred-Pre: 68 %
FEV1-Post: 2.41 L
FEV1-Pre: 2.14 L
FEV1FVC-%Change-Post: -1 %
FEV1FVC-%Pred-Pre: 97 %
FEV6-%Change-Post: 14 %
FEV6-%Pred-Post: 84 %
FEV6-%Pred-Pre: 74 %
FEV6-Post: 3.37 L
FEV6-Pre: 2.94 L
FEV6FVC-%Change-Post: 0 %
FEV6FVC-%Pred-Post: 105 %
FEV6FVC-%Pred-Pre: 105 %
FVC-%Change-Post: 14 %
FVC-%Pred-Post: 80 %
FVC-%Pred-Pre: 70 %
FVC-Post: 3.38 L
FVC-Pre: 2.95 L
Post FEV1/FVC ratio: 72 %
Post FEV6/FVC ratio: 100 %
Pre FEV1/FVC ratio: 73 %
Pre FEV6/FVC Ratio: 100 %
RV % pred: 167 %
RV: 3.71 L
TLC % pred: 108 %
TLC: 7.08 L

## 2020-04-29 MED ORDER — CHANTIX STARTING MONTH PAK 0.5 MG X 11 & 1 MG X 42 PO TABS: ORAL_TABLET | ORAL | 0 refills | Status: DC

## 2020-04-29 NOTE — Progress Notes (Signed)
[facility]  Chief Complaint  Patient presents with  . Follow-up    productive cough in mornings with yellowish-tan phlegm    Constitutional:  BP 112/70 (BP Location: Left Arm, Cuff Size: Normal)   Pulse (!) 47   Temp 97.8 F (36.6 C) (Other (Comment)) Comment (Src): wrist  Ht 5' 7.5" (1.715 m)   Wt 184 lb (83.5 kg)   SpO2 98% Comment: Room air  BMI 28.39 kg/m   Past Medical History:  OA, Constipation, Depression, GERD  Past Surgical History:  His  has a past surgical history that includes Appendectomy (2001); I & D  RIGHT LEG INFECTION, EXTENSIVE (AGE 81); Hemorrhoid surgery (N/A, 04/02/2014); Leg Surgery (Right); Colonoscopy with propofol (N/A, 10/03/2019); and polypectomy (10/03/2019).  Brief Summary:  Mitchell Dean is a 64 y.o. 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      Subjective:   He was previously seen by Dr. Ashok Cordia.  He used to smoked up to 2 ppd.  He is now down to 4 to 6 cigarettes per day.  He tried chantix and wellbutrin before - he was able to tolerate these, but still kept smoking.  He has been using nicotine lozenges.  He has occasional cough with clear sputum.  Keeps up with usual activity, but gets winded with more strenuous exercise.    He feels that trelegy works better than symbicort.  Hasn't needed to use albuterol much recently.  PFT today shows mild obstruction, positive bronchodilator response, and air trapping.  Physical Exam:   Appearance - well kempt   ENMT - no sinus tenderness, no oral exudate, no LAN, Mallampati 3 airway, no stridor  Respiratory - equal breath sounds bilaterally, no wheezing or rales  CV - s1s2 regular rate and rhythm, no murmurs  Ext - no clubbing, no edema  Skin - no rashes  Psych - normal mood and affect   Pulmonary testing:   ABG RA 08/04/16 >> pH 7.41, PCO2 31.3, PO2 58  PFT 09/28/16 >> FEV1 2.26 (71%), FEV1% 73, TLC 6.64 (103%), DLCO 66%,  +BD  A1AT 09/28/16 >> 137, MM  Spirometry 12/30/16 >> FEV1 2.01, FEV1% 66  PFT 04/29/20 >> FEV1 2.41 (77%), FEV1% 72, TLC 7.08 (108%), DLCO 85%, +BD  Chest Imaging:   LDCT 12/19/18 >> centrilobular emphysema  LDCT 02/12/20 >> centrilobular emphysema, faint GGO  Social History:  He  reports that he has been smoking cigarettes. He started smoking about 49 years ago. He has a 67.50 pack-year smoking history. He has never used smokeless tobacco. He reports current alcohol use of about 1.0 standard drink of alcohol per week. He reports that he does not use drugs.  Family History:  His family history includes Emphysema in his father.     Assessment/Plan:   COPD from asthma and emphysema. - reviewed his PFT with him - continue trelegy with prn albuterol  Tobacco abuse. - reviewed options to help with smoking cessation - he is willing to try chantix again; discussed dosing schedule, setting quit date, and side effects to monitor for  Ground glass opacification on CT chest. 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 - he will need follow up low dose CT chest in August 2022  Time Spent Involved in Patient Care on Day of Examination:  43 minutes  Follow up:  Patient Instructions  Fill script for chantix when you are  ready  Follow up in 4 months   Medication List:   Allergies as of 04/29/2020      Reactions   Contrast Media [iodinated Diagnostic Agents] Swelling   Eye swelling   Iodine Swelling      Medication List       Accurate as of April 29, 2020 10:58 AM. If you have any questions, ask your nurse or doctor.        albuterol 108 (90 Base) MCG/ACT inhaler Commonly known as: VENTOLIN HFA Inhale 2 puffs into the lungs every 4 (four) hours as needed for wheezing or shortness of breath.   ALPRAZolam 1 MG tablet Commonly known as: XANAX Take 1 mg by mouth 3 (three) times daily as needed for anxiety.    Chantix Starting Month Pak 0.5 MG X 11 & 1 MG X 42 tablet Generic drug: varenicline Take one 0.5 mg tablet by mouth once daily for 3 days, then increase to one 0.5 mg tablet twice daily for 4 days, then increase to one 1 mg tablet twice daily. Started by: Chesley Mires, MD   FLUoxetine 20 MG capsule Commonly known as: PROZAC Take 20 mg by mouth daily.   multivitamin tablet Take 1 tablet by mouth daily.   nicotine polacrilex 4 MG lozenge Commonly known as: Commit Take 1 lozenge (4 mg total) by mouth as needed for smoking cessation.   Trelegy Ellipta 100-62.5-25 MCG/INH Aepb Generic drug: Fluticasone-Umeclidin-Vilant Inhale 1 puff into the lungs daily.   zolpidem 10 MG tablet Commonly known as: AMBIEN Take 10 mg by mouth at bedtime.       Signature:  Chesley Mires, MD [place] Pager - [phone] 04/29/2020, 10:58 AM

## 2020-04-29 NOTE — Patient Instructions (Signed)
Fill script for chantix when you are ready  Follow up in 4 months

## 2020-04-29 NOTE — Progress Notes (Signed)
Full PFT performed today. °

## 2020-06-27 ENCOUNTER — Ambulatory Visit: Payer: Medicare Other | Attending: Internal Medicine

## 2020-06-27 DIAGNOSIS — Z23 Encounter for immunization: Secondary | ICD-10-CM

## 2020-06-27 NOTE — Progress Notes (Signed)
   Covid-19 Vaccination Clinic  Name:  Mitchell Dean    MRN: [id number] DOB: 1956/01/19  06/27/2020  [name] was observed post Covid-19 immunization for 15 minutes without incident. He was provided with Vaccine Information Sheet and instruction to access the V-Safe system.   [name] was instructed to call 911 with any severe reactions post vaccine: Marland Kitchen Difficulty breathing  . Swelling of face and throat  . A fast heartbeat  . A bad rash all over body  . Dizziness and weakness   Immunizations Administered    Name Date Dose VIS Date Route   Pfizer COVID-19 Vaccine 06/27/2020 11:06 AM 0.3 mL 04/29/2020 Intramuscular   Manufacturer: [place]   Lot: [id number]   [place]: [id number]

## 2020-08-05 ENCOUNTER — Other Ambulatory Visit: Payer: Self-pay

## 2020-08-05 ENCOUNTER — Ambulatory Visit
Admission: RE | Admit: 2020-08-05 | Discharge: 2020-08-05 | Disposition: A | Discharge status: Home / Self Care | Payer: Medicare Other | Source: Ambulatory Visit | Attending: Family Medicine | Admitting: Family Medicine

## 2020-08-05 ENCOUNTER — Other Ambulatory Visit: Payer: Self-pay | Admitting: Family Medicine

## 2020-08-05 DIAGNOSIS — J449 Chronic obstructive pulmonary disease, unspecified: Secondary | ICD-10-CM | POA: Diagnosis not present

## 2020-08-05 DIAGNOSIS — R222 Localized swelling, mass and lump, trunk: Secondary | ICD-10-CM

## 2020-10-21 DIAGNOSIS — H43811 Vitreous degeneration, right eye: Secondary | ICD-10-CM | POA: Diagnosis not present

## 2020-10-21 DIAGNOSIS — H0520 Unspecified exophthalmos: Secondary | ICD-10-CM | POA: Diagnosis not present

## 2020-11-25 DIAGNOSIS — H04123 Dry eye syndrome of bilateral lacrimal glands: Secondary | ICD-10-CM | POA: Diagnosis not present

## 2020-11-25 DIAGNOSIS — H16143 Punctate keratitis, bilateral: Secondary | ICD-10-CM | POA: Diagnosis not present

## 2020-11-25 DIAGNOSIS — H43811 Vitreous degeneration, right eye: Secondary | ICD-10-CM | POA: Diagnosis not present

## 2020-11-25 DIAGNOSIS — H43391 Other vitreous opacities, right eye: Secondary | ICD-10-CM | POA: Diagnosis not present

## 2020-12-15 DIAGNOSIS — Z0000 Encounter for general adult medical examination without abnormal findings: Secondary | ICD-10-CM | POA: Diagnosis not present

## 2020-12-15 DIAGNOSIS — Z1159 Encounter for screening for other viral diseases: Secondary | ICD-10-CM | POA: Diagnosis not present

## 2020-12-15 DIAGNOSIS — G4700 Insomnia, unspecified: Secondary | ICD-10-CM | POA: Diagnosis not present

## 2020-12-15 DIAGNOSIS — Z23 Encounter for immunization: Secondary | ICD-10-CM | POA: Diagnosis not present

## 2020-12-15 DIAGNOSIS — E7800 Pure hypercholesterolemia, unspecified: Secondary | ICD-10-CM | POA: Diagnosis not present

## 2020-12-15 DIAGNOSIS — F17200 Nicotine dependence, unspecified, uncomplicated: Secondary | ICD-10-CM | POA: Diagnosis not present

## 2020-12-15 DIAGNOSIS — J449 Chronic obstructive pulmonary disease, unspecified: Secondary | ICD-10-CM | POA: Diagnosis not present

## 2021-01-26 ENCOUNTER — Ambulatory Visit: Payer: Medicare Other | Admitting: Podiatry

## 2021-02-02 ENCOUNTER — Ambulatory Visit: Payer: Medicare Other | Admitting: Podiatry

## 2021-02-02 ENCOUNTER — Other Ambulatory Visit: Payer: Self-pay

## 2021-02-02 ENCOUNTER — Telehealth: Payer: Self-pay | Admitting: Podiatry

## 2021-02-02 ENCOUNTER — Encounter: Payer: Self-pay | Admitting: Podiatry

## 2021-02-02 DIAGNOSIS — B351 Tinea unguium: Secondary | ICD-10-CM

## 2021-02-02 DIAGNOSIS — D2372 Other benign neoplasm of skin of left lower limb, including hip: Secondary | ICD-10-CM | POA: Diagnosis not present

## 2021-02-02 MED ORDER — UREA 25 % EX LOTN: 1.0000 "application " | TOPICAL_LOTION | Freq: Every day | CUTANEOUS | 0 refills | Status: DC

## 2021-02-02 NOTE — Telephone Encounter (Signed)
Patient called and stated that he couldn't get the medication that was prescribed today from his pharmacy. He was told to call back to speak to Dr. Jacqualyn Posey about another alternative

## 2021-02-02 NOTE — Patient Instructions (Signed)
Keep the bandage on for 24 hours. At that time, remove and clean with soap and water. If it hurts or burns before 24 hours go ahead and remove the bandage and wash with soap and water. Keep the area clean. If there is any blistering cover with antibiotic ointment and a bandage. Monitor for any redness, drainage, or other signs of infection. Call the office if any are to occur. If you have any questions, please call the office at [phone].  You can file the calluses down with a pumice stone the apply the urea lotion that I sent to the pharmacy.

## 2021-02-05 NOTE — Telephone Encounter (Signed)
Called and spoke with the patient and patient stated that he got the message and would come to [place] office on Monday and get the urea cream and patient stated that the draining has stopped quite a bit and has been using a palmus stone and I stated to call the [place] office if any concerns or questions. Lattie Haw

## 2021-02-05 NOTE — Progress Notes (Signed)
Subjective:   Patient ID: Mitchell Dean, male   DOB: 65 y.o.   MRN: [id number]   HPI 65 year old male presents the office today for concerns of painful skin lesion bottom of his left foot which is been ongoing for 8 to 9 months.  He has not had any recent treatment.  He tries to trim it down himself.  No recent injury or stepping any foreign objects.  No swelling or redness or any drainage.  Also has been using over-the-counter fungal nail lacquer which helps the toenail fungus.  No open lesions.   Review of Systems  All other systems reviewed and are negative.  Past Medical History:  Diagnosis Date   Arthritis    hands   Constipation    COPD (chronic obstructive pulmonary disease) (HCC)    Depression    Mild acid reflux    Minimal   Prolapsed internal hemorrhoids     Past Surgical History:  Procedure Laterality Date   APPENDECTOMY  2001   COLONOSCOPY WITH PROPOFOL N/A 10/03/2019   Procedure: COLONOSCOPY WITH PROPOFOL;  Surgeon: Clarene Essex, MD;  Location: [facility];  Service: Endoscopy;  Laterality: N/A;   HEMORRHOID SURGERY N/A 04/02/2014   Procedure: EXCISION OF INTERNAL HEMORRHOIDS;  Surgeon: Leighton Ruff, MD;  Location: [facility];  Service: General;  Laterality: N/A;   I & D  RIGHT LEG INFECTION, EXTENSIVE  AGE 67   LEG SURGERY Right    POLYPECTOMY  10/03/2019   Procedure: POLYPECTOMY;  Surgeon: Clarene Essex, MD;  Location: [facility];  Service: Endoscopy;;     Current Outpatient Medications:    UREA, EMOLLIENT, 25 % LOTN, Apply 1 application topically daily., Disp: 120 mL, Rfl: 0   albuterol (PROVENTIL HFA;VENTOLIN HFA) 108 (90 Base) MCG/ACT inhaler, Inhale 2 puffs into the lungs every 4 (four) hours as needed for wheezing or shortness of breath., Disp: 1 Inhaler, Rfl: 2   ALPRAZolam (XANAX) 1 MG tablet, Take 1 mg by mouth 3 (three) times daily as needed for anxiety., Disp: , Rfl: 5   FLUoxetine (PROZAC) 20 MG capsule, Take 20 mg by mouth daily.,  Disp: , Rfl:    Fluticasone-Umeclidin-Vilant (TRELEGY ELLIPTA) 100-62.5-25 MCG/INH AEPB, Inhale 1 puff into the lungs daily., Disp: , Rfl:    Multiple Vitamin (MULTIVITAMIN) tablet, Take 1 tablet by mouth daily., Disp: , Rfl:    nicotine polacrilex (COMMIT) 4 MG lozenge, Take 1 lozenge (4 mg total) by mouth as needed for smoking cessation., Disp: 108 tablet, Rfl: 3   zolpidem (AMBIEN) 10 MG tablet, Take 10 mg by mouth at bedtime., Disp: , Rfl:   Allergies  Allergen Reactions   Contrast Media [Iodinated Diagnostic Agents] Swelling    Eye swelling   Iodine Swelling          Objective:  Physical Exam  General: AAO x3, NAD  Dermatological: Thick hyperkeratotic lesion plantar aspect right heel.  Upon debridement there is no underlying ulceration drainage or any signs of infection.  Nails are mildly hypertrophic, dystrophic with yellow discoloration.  No pain the nail there is no redness or drainage or any signs of infection.  No open lesions.  Vascular: Dorsalis Pedis artery and Posterior Tibial artery pedal pulses are 2/4 bilateral with immedate capillary fill time. There is no pain with calf compression, swelling, warmth, erythema.   Neruologic: Grossly intact via light touch bilateral.   Musculoskeletal: No gross boney pedal deformities bilateral. No pain, crepitus, or limitation noted with foot and ankle range  of motion bilateral. Muscular strength 5/5 in all groups tested bilateral.  Gait: Unassisted, Nonantalgic.       Assessment:   65 year old male with skin lesion right heel, onychomycosis     Plan:  -Treatment options discussed including all alternatives, risks, and complications -Etiology of symptoms were discussed -Sharply debrided the hyperkeratotic lesions without any complications or bleeding to the right heel.  The skin was cleaned with alcohol and a pad was placed followed by salicylic acid and a bandage.  Post procedure instructions discussed.  Monitor for any  signs or symptoms of infection.  I ordered urea lotion he can use as well. -Discussed treatment options for onychomycosis.  He seems to be doing well with over-the-counter medications and continue with this.  Trula Slade DPM

## 2021-04-12 ENCOUNTER — Ambulatory Visit: Payer: Medicare Other | Admitting: Podiatry

## 2021-05-11 DIAGNOSIS — F17210 Nicotine dependence, cigarettes, uncomplicated: Secondary | ICD-10-CM | POA: Diagnosis not present

## 2021-05-11 DIAGNOSIS — H6522 Chronic serous otitis media, left ear: Secondary | ICD-10-CM | POA: Diagnosis not present

## 2021-06-10 DIAGNOSIS — Z9622 Myringotomy tube(s) status: Secondary | ICD-10-CM | POA: Diagnosis not present

## 2021-06-10 DIAGNOSIS — H6522 Chronic serous otitis media, left ear: Secondary | ICD-10-CM | POA: Diagnosis not present

## 2021-06-16 DIAGNOSIS — G4700 Insomnia, unspecified: Secondary | ICD-10-CM | POA: Diagnosis not present

## 2021-06-16 DIAGNOSIS — F17200 Nicotine dependence, unspecified, uncomplicated: Secondary | ICD-10-CM | POA: Diagnosis not present

## 2021-06-16 DIAGNOSIS — I700 Atherosclerosis of aorta: Secondary | ICD-10-CM | POA: Diagnosis not present

## 2021-06-16 DIAGNOSIS — J449 Chronic obstructive pulmonary disease, unspecified: Secondary | ICD-10-CM | POA: Diagnosis not present

## 2021-06-16 DIAGNOSIS — E7800 Pure hypercholesterolemia, unspecified: Secondary | ICD-10-CM | POA: Diagnosis not present

## 2021-06-22 DIAGNOSIS — M47816 Spondylosis without myelopathy or radiculopathy, lumbar region: Secondary | ICD-10-CM | POA: Diagnosis not present

## 2021-09-21 DIAGNOSIS — Z9622 Myringotomy tube(s) status: Secondary | ICD-10-CM | POA: Diagnosis not present

## 2021-09-21 DIAGNOSIS — H9012 Conductive hearing loss, unilateral, left ear, with unrestricted hearing on the contralateral side: Secondary | ICD-10-CM | POA: Diagnosis not present

## 2021-10-13 DIAGNOSIS — H918X2 Other specified hearing loss, left ear: Secondary | ICD-10-CM | POA: Diagnosis not present

## 2021-12-28 DIAGNOSIS — J449 Chronic obstructive pulmonary disease, unspecified: Secondary | ICD-10-CM | POA: Diagnosis not present

## 2021-12-28 DIAGNOSIS — I700 Atherosclerosis of aorta: Secondary | ICD-10-CM | POA: Diagnosis not present

## 2021-12-28 DIAGNOSIS — G4700 Insomnia, unspecified: Secondary | ICD-10-CM | POA: Diagnosis not present

## 2021-12-28 DIAGNOSIS — E7800 Pure hypercholesterolemia, unspecified: Secondary | ICD-10-CM | POA: Diagnosis not present

## 2021-12-28 DIAGNOSIS — K429 Umbilical hernia without obstruction or gangrene: Secondary | ICD-10-CM | POA: Diagnosis not present

## 2021-12-28 DIAGNOSIS — Z0000 Encounter for general adult medical examination without abnormal findings: Secondary | ICD-10-CM | POA: Diagnosis not present

## 2021-12-28 DIAGNOSIS — F17200 Nicotine dependence, unspecified, uncomplicated: Secondary | ICD-10-CM | POA: Diagnosis not present

## 2021-12-31 ENCOUNTER — Encounter: Payer: Self-pay | Admitting: Pulmonary Disease

## 2021-12-31 ENCOUNTER — Ambulatory Visit: Payer: Medicare Other | Admitting: Pulmonary Disease

## 2021-12-31 VITALS — BP 124/76 | HR 71 | Temp 98.4°F | Ht 67.0 in | Wt 194.4 lb

## 2021-12-31 DIAGNOSIS — Z720 Tobacco use: Secondary | ICD-10-CM

## 2021-12-31 DIAGNOSIS — J449 Chronic obstructive pulmonary disease, unspecified: Secondary | ICD-10-CM

## 2022-02-01 DIAGNOSIS — D125 Benign neoplasm of sigmoid colon: Secondary | ICD-10-CM | POA: Diagnosis not present

## 2022-02-01 DIAGNOSIS — K649 Unspecified hemorrhoids: Secondary | ICD-10-CM | POA: Diagnosis not present

## 2022-02-01 DIAGNOSIS — Z86010 Personal history of colonic polyps: Secondary | ICD-10-CM | POA: Diagnosis not present

## 2022-02-01 DIAGNOSIS — D123 Benign neoplasm of transverse colon: Secondary | ICD-10-CM | POA: Diagnosis not present

## 2022-02-01 DIAGNOSIS — K5730 Diverticulosis of large intestine without perforation or abscess without bleeding: Secondary | ICD-10-CM | POA: Diagnosis not present

## 2022-02-01 DIAGNOSIS — Z09 Encounter for follow-up examination after completed treatment for conditions other than malignant neoplasm: Secondary | ICD-10-CM | POA: Diagnosis not present

## 2022-02-01 DIAGNOSIS — D124 Benign neoplasm of descending colon: Secondary | ICD-10-CM | POA: Diagnosis not present

## 2022-02-03 DIAGNOSIS — D123 Benign neoplasm of transverse colon: Secondary | ICD-10-CM | POA: Diagnosis not present

## 2022-02-03 DIAGNOSIS — L02421 Furuncle of right axilla: Secondary | ICD-10-CM | POA: Diagnosis not present

## 2022-02-03 DIAGNOSIS — D124 Benign neoplasm of descending colon: Secondary | ICD-10-CM | POA: Diagnosis not present

## 2022-02-03 DIAGNOSIS — D125 Benign neoplasm of sigmoid colon: Secondary | ICD-10-CM | POA: Diagnosis not present

## 2022-04-12 ENCOUNTER — Ambulatory Visit
Admission: RE | Admit: 2022-04-12 | Discharge: 2022-04-12 | Disposition: A | Discharge status: Home / Self Care | Payer: Medicare Other | Source: Ambulatory Visit | Attending: Pulmonary Disease | Admitting: Pulmonary Disease

## 2022-04-12 DIAGNOSIS — Z720 Tobacco use: Secondary | ICD-10-CM

## 2022-04-12 DIAGNOSIS — F17210 Nicotine dependence, cigarettes, uncomplicated: Secondary | ICD-10-CM | POA: Diagnosis not present

## 2022-04-15 ENCOUNTER — Other Ambulatory Visit: Payer: Self-pay | Admitting: *Deleted

## 2022-04-15 ENCOUNTER — Encounter: Payer: Self-pay | Admitting: *Deleted

## 2022-04-15 DIAGNOSIS — Z122 Encounter for screening for malignant neoplasm of respiratory organs: Secondary | ICD-10-CM

## 2022-04-15 DIAGNOSIS — Z87891 Personal history of nicotine dependence: Secondary | ICD-10-CM

## 2022-04-15 DIAGNOSIS — F17210 Nicotine dependence, cigarettes, uncomplicated: Secondary | ICD-10-CM

## 2022-04-26 DIAGNOSIS — M47816 Spondylosis without myelopathy or radiculopathy, lumbar region: Secondary | ICD-10-CM | POA: Diagnosis not present

## 2022-05-02 IMAGING — CR DG CHEST 2V
2 series · 2 of 2 positions shown · non-contrast
Comparison: 08/03/2016

CLINICAL DATA: Bilateral neck swelling for 5 months. History of
COPD. Current smoker.

EXAM:
CHEST - 2 VIEW

[w chest pa]
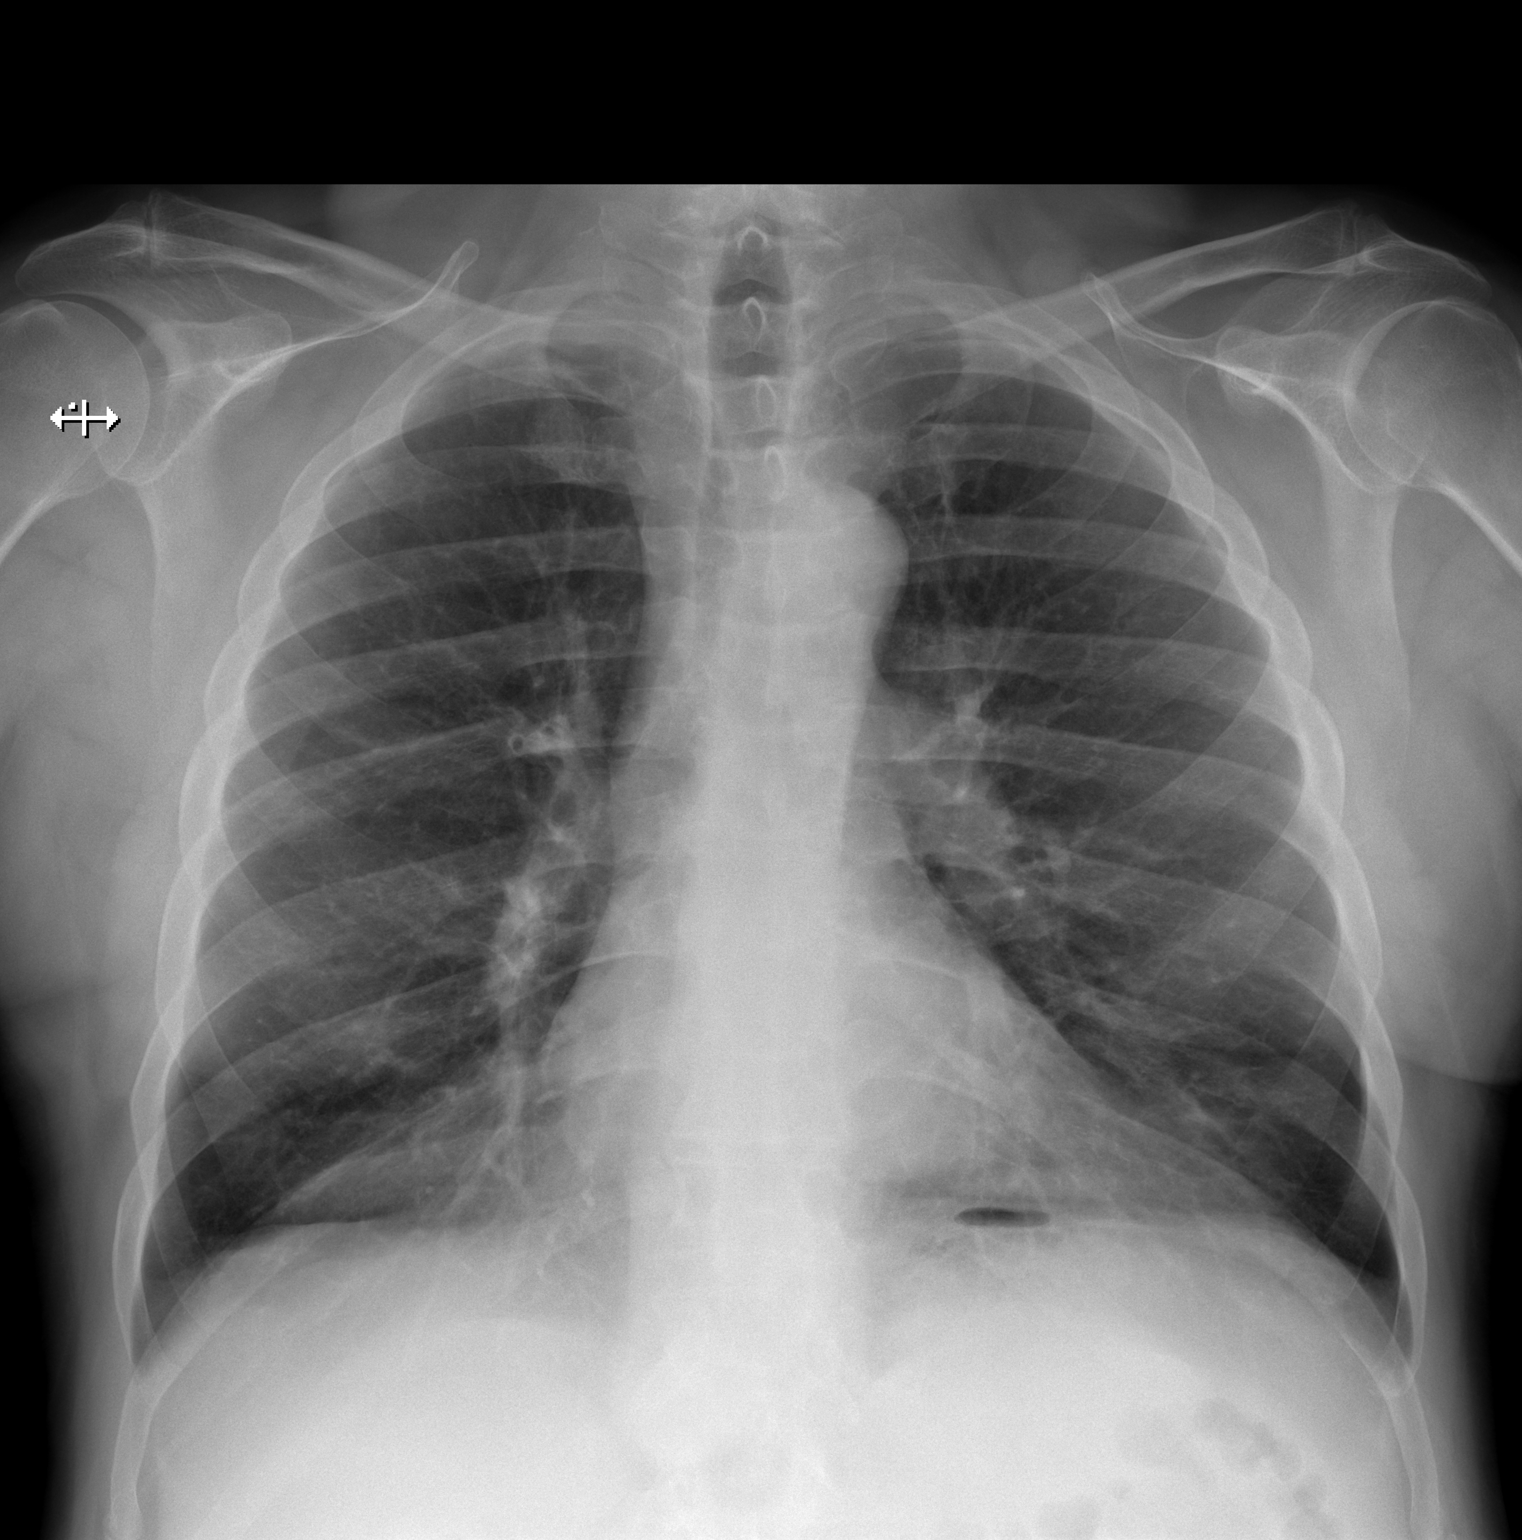

[w chest lat]
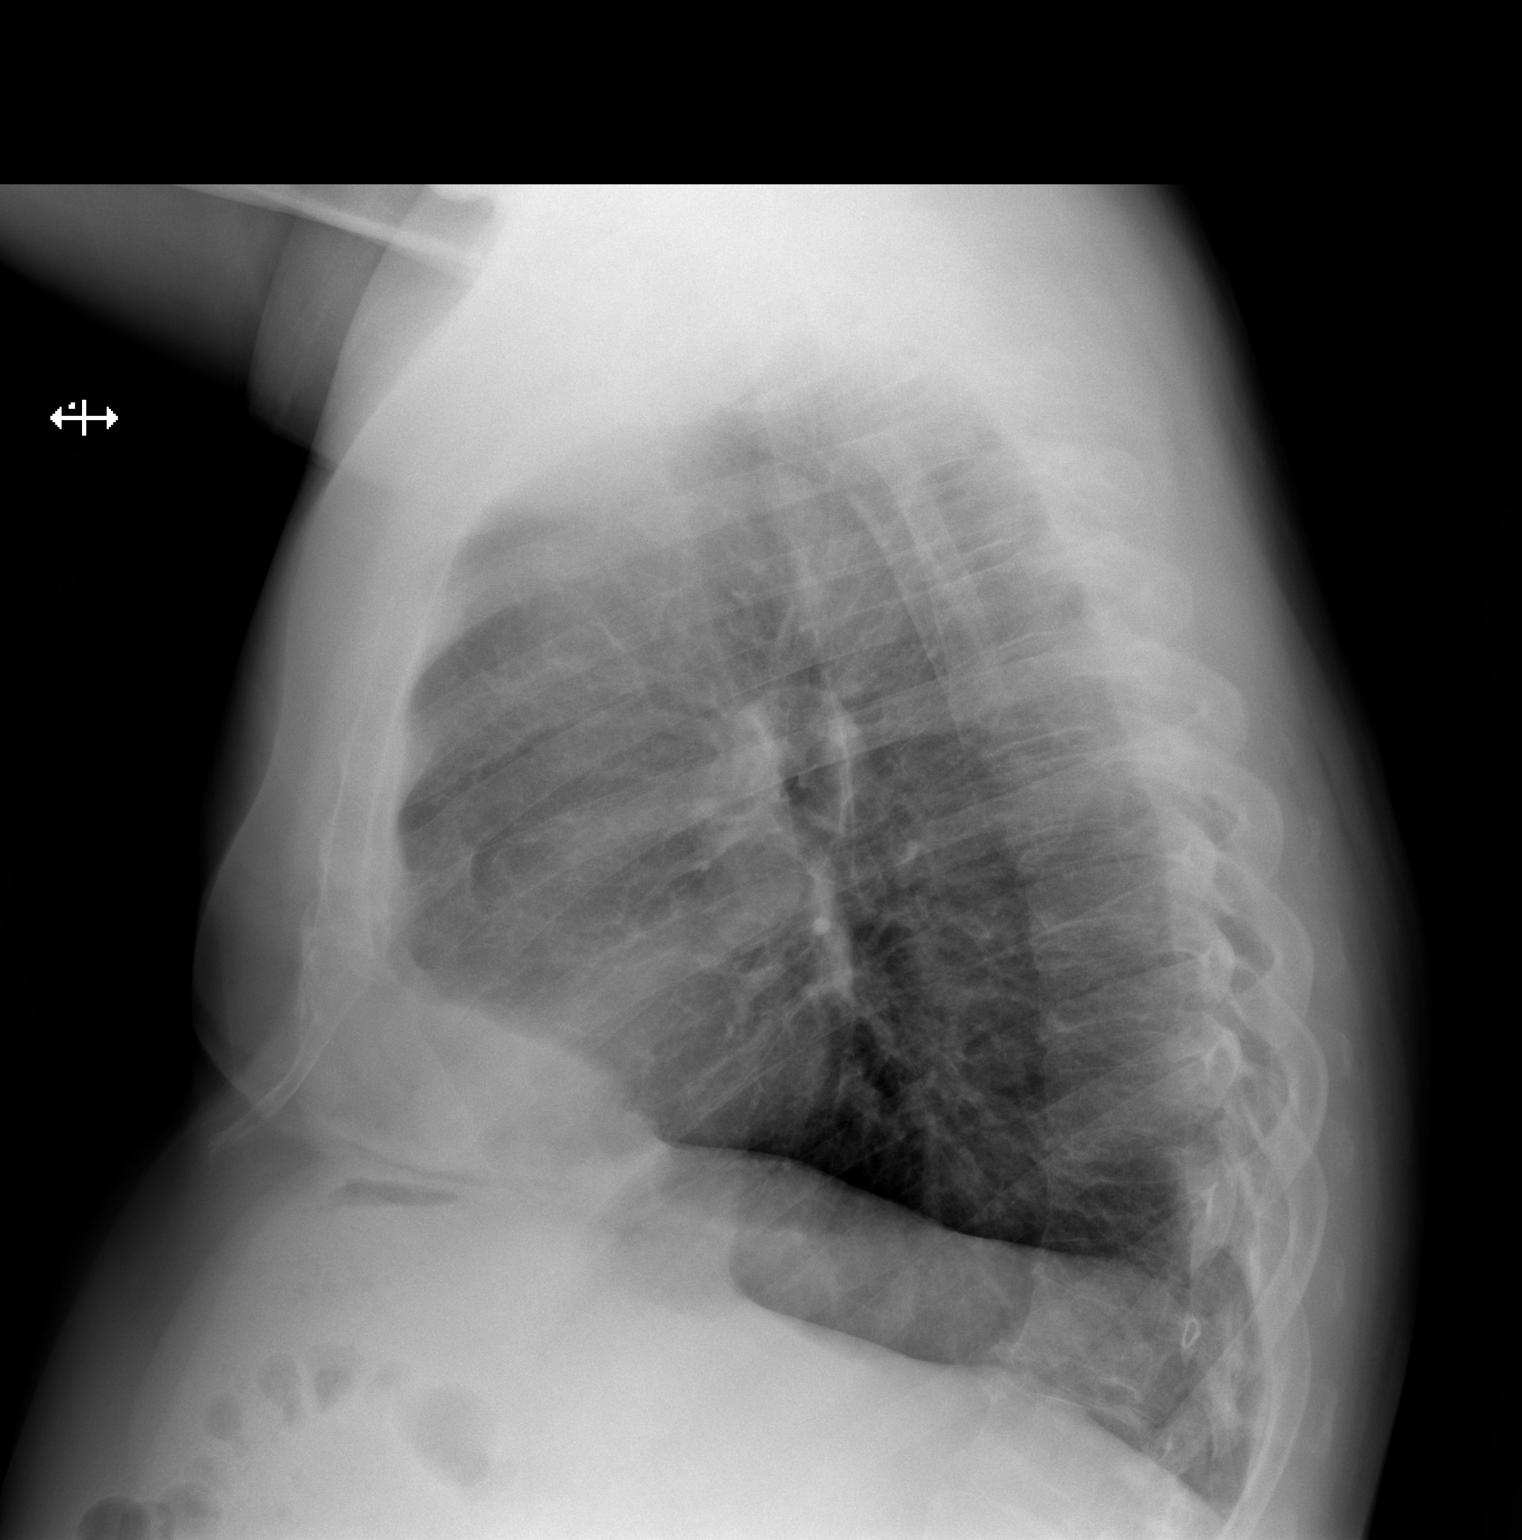

[2 of 2 positions shown; findings below may reference images not displayed]

FINDINGS: Heart size and pulmonary vascularity are normal. Emphysematous
changes in the lungs. No airspace disease or consolidation. No
pleural effusions. No pneumothorax. Mediastinal contours appear
intact. Soft tissue prominence at the base of the neck is similar to
prior study. No radiopaque soft tissue foreign bodies or gas.
IMPRESSION: Emphysematous changes in the lungs. No evidence of active pulmonary
disease.

## 2022-06-27 DIAGNOSIS — Z23 Encounter for immunization: Secondary | ICD-10-CM | POA: Diagnosis not present

## 2022-06-27 DIAGNOSIS — J449 Chronic obstructive pulmonary disease, unspecified: Secondary | ICD-10-CM | POA: Diagnosis not present

## 2022-06-27 DIAGNOSIS — E7800 Pure hypercholesterolemia, unspecified: Secondary | ICD-10-CM | POA: Diagnosis not present

## 2022-06-27 DIAGNOSIS — G4700 Insomnia, unspecified: Secondary | ICD-10-CM | POA: Diagnosis not present

## 2022-06-27 DIAGNOSIS — Z79899 Other long term (current) drug therapy: Secondary | ICD-10-CM | POA: Diagnosis not present

## 2022-06-27 DIAGNOSIS — I700 Atherosclerosis of aorta: Secondary | ICD-10-CM | POA: Diagnosis not present

## 2022-06-27 DIAGNOSIS — K5901 Slow transit constipation: Secondary | ICD-10-CM | POA: Diagnosis not present

## 2022-10-21 DIAGNOSIS — H2513 Age-related nuclear cataract, bilateral: Secondary | ICD-10-CM | POA: Diagnosis not present

## 2022-10-21 DIAGNOSIS — H43811 Vitreous degeneration, right eye: Secondary | ICD-10-CM | POA: Diagnosis not present

## 2022-10-21 DIAGNOSIS — H25013 Cortical age-related cataract, bilateral: Secondary | ICD-10-CM | POA: Diagnosis not present

## 2023-01-09 DIAGNOSIS — E7800 Pure hypercholesterolemia, unspecified: Secondary | ICD-10-CM | POA: Diagnosis not present

## 2023-01-09 DIAGNOSIS — G4700 Insomnia, unspecified: Secondary | ICD-10-CM | POA: Diagnosis not present

## 2023-01-09 DIAGNOSIS — J449 Chronic obstructive pulmonary disease, unspecified: Secondary | ICD-10-CM | POA: Diagnosis not present

## 2023-01-09 DIAGNOSIS — K5901 Slow transit constipation: Secondary | ICD-10-CM | POA: Diagnosis not present

## 2023-01-09 DIAGNOSIS — Z0000 Encounter for general adult medical examination without abnormal findings: Secondary | ICD-10-CM | POA: Diagnosis not present

## 2023-01-09 DIAGNOSIS — I700 Atherosclerosis of aorta: Secondary | ICD-10-CM | POA: Diagnosis not present

## 2023-01-09 DIAGNOSIS — D582 Other hemoglobinopathies: Secondary | ICD-10-CM | POA: Diagnosis not present

## 2023-04-14 ENCOUNTER — Ambulatory Visit
Admission: RE | Admit: 2023-04-14 | Discharge: 2023-04-14 | Disposition: A | Discharge status: Home / Self Care | Payer: Medicare Other | Source: Ambulatory Visit | Attending: Family Medicine | Admitting: Family Medicine

## 2023-04-14 DIAGNOSIS — Z87891 Personal history of nicotine dependence: Secondary | ICD-10-CM

## 2023-04-14 DIAGNOSIS — F17210 Nicotine dependence, cigarettes, uncomplicated: Secondary | ICD-10-CM

## 2023-04-14 DIAGNOSIS — Z122 Encounter for screening for malignant neoplasm of respiratory organs: Secondary | ICD-10-CM

## 2023-04-17 DIAGNOSIS — M47816 Spondylosis without myelopathy or radiculopathy, lumbar region: Secondary | ICD-10-CM | POA: Diagnosis not present

## 2023-05-02 ENCOUNTER — Other Ambulatory Visit: Payer: Self-pay

## 2023-05-02 DIAGNOSIS — Z122 Encounter for screening for malignant neoplasm of respiratory organs: Secondary | ICD-10-CM

## 2023-05-02 DIAGNOSIS — Z87891 Personal history of nicotine dependence: Secondary | ICD-10-CM

## 2023-05-02 DIAGNOSIS — F17210 Nicotine dependence, cigarettes, uncomplicated: Secondary | ICD-10-CM

## 2023-05-17 DIAGNOSIS — M47816 Spondylosis without myelopathy or radiculopathy, lumbar region: Secondary | ICD-10-CM | POA: Diagnosis not present

## 2023-07-18 DIAGNOSIS — I700 Atherosclerosis of aorta: Secondary | ICD-10-CM | POA: Diagnosis not present

## 2023-07-18 DIAGNOSIS — F17200 Nicotine dependence, unspecified, uncomplicated: Secondary | ICD-10-CM | POA: Diagnosis not present

## 2023-07-18 DIAGNOSIS — Z79899 Other long term (current) drug therapy: Secondary | ICD-10-CM | POA: Diagnosis not present

## 2023-07-18 DIAGNOSIS — J449 Chronic obstructive pulmonary disease, unspecified: Secondary | ICD-10-CM | POA: Diagnosis not present

## 2023-07-18 DIAGNOSIS — G4700 Insomnia, unspecified: Secondary | ICD-10-CM | POA: Diagnosis not present

## 2023-07-18 DIAGNOSIS — E7800 Pure hypercholesterolemia, unspecified: Secondary | ICD-10-CM | POA: Diagnosis not present

## 2023-11-15 ENCOUNTER — Ambulatory Visit: Admitting: Cardiovascular Disease

## 2024-01-30 ENCOUNTER — Other Ambulatory Visit: Payer: Self-pay | Admitting: Family Medicine

## 2024-01-30 DIAGNOSIS — R1030 Lower abdominal pain, unspecified: Secondary | ICD-10-CM | POA: Diagnosis not present

## 2024-01-30 DIAGNOSIS — J449 Chronic obstructive pulmonary disease, unspecified: Secondary | ICD-10-CM | POA: Diagnosis not present

## 2024-01-30 DIAGNOSIS — E7800 Pure hypercholesterolemia, unspecified: Secondary | ICD-10-CM | POA: Diagnosis not present

## 2024-01-30 DIAGNOSIS — F17200 Nicotine dependence, unspecified, uncomplicated: Secondary | ICD-10-CM | POA: Diagnosis not present

## 2024-01-30 DIAGNOSIS — D582 Other hemoglobinopathies: Secondary | ICD-10-CM | POA: Diagnosis not present

## 2024-01-30 DIAGNOSIS — I700 Atherosclerosis of aorta: Secondary | ICD-10-CM | POA: Diagnosis not present

## 2024-01-30 DIAGNOSIS — Z0000 Encounter for general adult medical examination without abnormal findings: Secondary | ICD-10-CM | POA: Diagnosis not present

## 2024-01-30 DIAGNOSIS — G4700 Insomnia, unspecified: Secondary | ICD-10-CM | POA: Diagnosis not present

## 2024-02-01 ENCOUNTER — Other Ambulatory Visit: Payer: Self-pay | Admitting: Family Medicine

## 2024-02-01 DIAGNOSIS — R1030 Lower abdominal pain, unspecified: Secondary | ICD-10-CM

## 2024-02-12 ENCOUNTER — Encounter: Payer: Self-pay | Admitting: Cardiovascular Disease

## 2024-02-12 ENCOUNTER — Ambulatory Visit: Attending: Cardiovascular Disease | Admitting: Cardiovascular Disease

## 2024-02-12 ENCOUNTER — Ambulatory Visit: Attending: Cardiovascular Disease

## 2024-02-12 VITALS — BP 114/80 | HR 126 | Ht 66.0 in | Wt 177.8 lb

## 2024-02-12 DIAGNOSIS — Z7689 Persons encountering health services in other specified circumstances: Secondary | ICD-10-CM

## 2024-02-12 DIAGNOSIS — E785 Hyperlipidemia, unspecified: Secondary | ICD-10-CM | POA: Insufficient documentation

## 2024-02-12 DIAGNOSIS — E782 Mixed hyperlipidemia: Secondary | ICD-10-CM | POA: Diagnosis not present

## 2024-02-12 DIAGNOSIS — F17200 Nicotine dependence, unspecified, uncomplicated: Secondary | ICD-10-CM

## 2024-02-12 DIAGNOSIS — R0609 Other forms of dyspnea: Secondary | ICD-10-CM

## 2024-02-12 DIAGNOSIS — I700 Atherosclerosis of aorta: Secondary | ICD-10-CM | POA: Diagnosis not present

## 2024-02-12 DIAGNOSIS — I4819 Other persistent atrial fibrillation: Secondary | ICD-10-CM | POA: Insufficient documentation

## 2024-02-12 MED ORDER — METOPROLOL SUCCINATE ER 25 MG PO TB24: 12.5000 mg | ORAL_TABLET | Freq: Every day | ORAL | 3 refills | Status: AC

## 2024-02-12 NOTE — Progress Notes (Signed)
 02/12/2024 Mitchell Dean   1955/10/04  [id number]  Primary Physician Claudene Pellet, MD Primary Cardiologist: Dorn JINNY Lesches MD [facility], [place], FSCAI  HPI:  Mitchell Dean is a 68 y.o. 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  He was referred by his primary care provider, Dr. Pellet Claudene, for aortic atherosclerosis.  His risk factors include over 100 pack years of tobacco abuse currently smoking 1 to 4 cigarettes a day.  He has untreated hyperlipidemia.  There is no family history of heart disease.  Is never had a heart attack or stroke.  Denies chest pain but does complain of dyspnea most likely from his COPD.  He did have a chest CT performed 10//24 that did show some aortic atherosclerosis but no evidence of coronary calcification.  He had a lipid profile performed a year ago revealed a total cholesterol of 231, LDL of 163 and HDL 39.   Current Meds  Medication Sig   albuterol  (PROVENTIL  HFA;VENTOLIN  HFA) 108 (90 Base) MCG/ACT inhaler Inhale 2 puffs into the lungs every 4 (four) hours as needed for wheezing or shortness of breath.   ALPRAZolam  (XANAX ) 1 MG tablet Take 1 mg by mouth 3 (three) times daily as needed for anxiety.   FLUoxetine  (PROZAC ) 20 MG capsule Take 20 mg by mouth daily.   Fluticasone -Umeclidin-Vilant (TRELEGY ELLIPTA) 100-62.5-25 MCG/INH AEPB Inhale 1 puff into the lungs daily.   Multiple Vitamin (MULTIVITAMIN) tablet Take 1 tablet by mouth daily.   zolpidem  (AMBIEN ) 10 MG tablet Take 10 mg by mouth at bedtime.     Allergies  Allergen Reactions   Contrast Media [Iodinated Contrast Media] Swelling    Eye swelling   Iodine Swelling    Social History   Socioeconomic History   Marital status: Single    Spouse name: Not on file   Number of children: Not on file   Years of education: Not on file   Highest education level: Not on file  Occupational History   Not  on file  Tobacco Use   Smoking status: Every Day    Current packs/day: 1.50    Average packs/day: 1.5 packs/day for 53.0 years (79.5 ttl pk-yrs)    Types: Cigarettes    Start date: 02/06/1971   Smokeless tobacco: Never  Vaping Use   Vaping status: Never Used  Substance and Sexual Activity   Alcohol use: Yes    Alcohol/week: 1.0 standard drink of alcohol    Types: 1 Cans of beer per week    Comment: Weekly   Drug use: No   Sexual activity: Not on file  Other Topics Concern   Not on file  Social History Narrative   [facility] (08/15/16):   Originally from [facility]. Previously worked in [employer]. Patient has lived in [place] & [place]. No pets currently. No mold or bird exposure.    Social Drivers of Corporate investment banker Strain: Not on file  Food Insecurity: Not on file  Transportation Needs: Not on file  Physical Activity: Not on file  Stress: Not on file  Social Connections: Not on file  Intimate Partner Violence: Not on file     Review of Systems: General: negative for chills, fever, night sweats or weight changes.  Cardiovascular: negative for chest pain, dyspnea on exertion, edema, orthopnea, palpitations, paroxysmal nocturnal dyspnea or shortness of breath Dermatological: negative for rash  Respiratory: negative for cough or wheezing Urologic: negative for hematuria Abdominal: negative for nausea, vomiting, diarrhea, bright red blood per rectum, melena, or hematemesis Neurologic: negative for visual changes, syncope, or dizziness All other systems reviewed and are otherwise negative except as noted above.    Blood pressure 114/80, pulse (!) 126, height 5' 6 (1.676 m), weight 177 lb 12.8 oz (80.6 kg), SpO2 98%.  General appearance: alert and no distress Neck: no adenopathy, no carotid bruit, no JVD, supple, symmetrical, trachea midline, and thyroid  not enlarged, symmetric, no tenderness/mass/nodules Lungs: clear to auscultation bilaterally Heart:  irregularly irregular rhythm Extremities: extremities normal, atraumatic, no cyanosis or edema Pulses: 2+ and symmetric Skin: Skin color, texture, turgor normal. No rashes or lesions Neurologic: Grossly normal  EKG EKG Interpretation Date/Time:  Monday February 12 2024 09:21:38 EDT Ventricular Rate:  126 PR Interval:    QRS Duration:  86 QT Interval:  278 QTC Calculation: 402 R Axis:   -31  Text Interpretation: Atrial fibrillation with rapid ventricular response Left axis deviation When compared with ECG of 03-Aug-2016 23:30, PREVIOUS ECG IS PRESENT Confirmed by Court Carrier [id number]) on 02/12/2024 9:43:34 AM    ASSESSMENT AND PLAN:   Tobacco use disorder Long history of tobacco abuse having smoked over 100 pack years now only smoking 1 to 4 cigarettes a day.  He does have a diagnosis of COPD and has dyspnea on exertion.  Talked about the importance of smoking cessation.  Persistent atrial fibrillation [facility]) Patient is an A-fib today with a ventricular sponsor 126.  He is unaware that he is in A-fib.  He says he does not know how long it has been it because he does not think he has had an EKG in a long time.  His CV score is 1.  He is asymptomatic from this.  I am going to put him on low-dose beta-blocker for rate control.  I am also going to get a -2-week Zio patch to further evaluate.  Hyperlipidemia History of hyperlipidemia not on statin therapy with lipid profile performed by his PCP 01/09/2023 revealing a total cholesterol of 231, LDL 163 and HDL of 39.  I did offer him/suggest that we start a statin drug however he was resistant to doing so.  I am going get a coronary calcium score to her stratify.  Dyspnea Patient has dyspnea most likely related to COPD.  I am going to get a 2D echo to further evaluate.     Carrier [name] MD FACP,FACC,FAHA, [facility] 02/12/2024 10:02 AM

## 2024-02-12 NOTE — Patient Instructions (Signed)
 Medication Instructions:  Your physician has recommended you make the following change in your medication:   -Start taking metoprolol  succinate (Toprol -XL) 12.5mg  (1/2 tablet) once daily.  *If you need a refill on your cardiac medications before your next appointment, please call your pharmacy*   Testing/Procedures: Your physician has requested that you have an echocardiogram. Echocardiography is a painless test that uses sound waves to create images of your heart. It provides your doctor with information about the size and shape of your heart and how well your heart's chambers and valves are working. This procedure takes approximately one hour. There are no restrictions for this procedure. Please do NOT wear cologne, perfume, aftershave, or lotions (deodorant is allowed). Please arrive 15 minutes prior to your appointment time.  Please note: We ask at that you not bring children with you during ultrasound (echo/ vascular) testing. Due to room size and safety concerns, children are not allowed in the ultrasound rooms during exams. Our front office staff cannot provide observation of children in our lobby area while testing is being conducted. An adult accompanying a patient to their appointment will only be allowed in the ultrasound room at the discretion of the ultrasound technician under special circumstances. We apologize for any inconvenience.   Dr. Court has ordered a CT coronary calcium score.   Test locations:  [facility] at [facility]  [facility] at [facility]  This is $99 out of pocket.   Coronary CalciumScan A coronary calcium scan is an imaging test used to look for deposits of calcium and other fatty materials (plaques) in the inner lining of the blood vessels of the heart (coronary arteries). These deposits of calcium and plaques can partly clog and narrow the coronary  arteries without producing any symptoms or warning signs. This puts a person at risk for a heart attack. This test can detect these deposits before symptoms develop. Tell a health care provider about: Any allergies you have. All medicines you are taking, including vitamins, herbs, eye drops, creams, and over-the-counter medicines. Any problems you or family members have had with anesthetic medicines. Any blood disorders you have. Any surgeries you have had. Any medical conditions you have. Whether you are pregnant or may be pregnant. What are the risks? Generally, this is a safe procedure. However, problems may occur, including: Harm to a pregnant woman and her unborn baby. This test involves the use of radiation. Radiation exposure can be dangerous to a pregnant woman and her unborn baby. If you are pregnant, you generally should not have this procedure done. Slight increase in the risk of cancer. This is because of the radiation involved in the test. What happens before the procedure? No preparation is needed for this procedure. What happens during the procedure? You will undress and remove any jewelry around your neck or chest. You will put on a hospital gown. Sticky electrodes will be placed on your chest. The electrodes will be connected to an electrocardiogram (ECG) machine to record a tracing of the electrical activity of your heart. A CT scanner will take pictures of your heart. During this time, you will be asked to lie still and hold your breath for 2-3 seconds while a picture of your heart is being taken. The procedure may vary among health care providers and hospitals. What happens after the procedure? You can get dressed. You can return to your normal activities. It is up to you  to get the results of your test. Ask your health care provider, or the department that is doing the test, when your results will be ready. Summary A coronary calcium scan is an imaging test used to look  for deposits of calcium and other fatty materials (plaques) in the inner lining of the blood vessels of the heart (coronary arteries). Generally, this is a safe procedure. Tell your health care provider if you are pregnant or may be pregnant. No preparation is needed for this procedure. A CT scanner will take pictures of your heart. You can return to your normal activities after the scan is done. This information is not intended to replace advice given to you by your health care provider. Make sure you discuss any questions you have with your health care provider. Document Released: 12/24/2007 Document Revised: 05/16/2016 Document Reviewed: 05/16/2016 Elsevier Interactive Patient Education  2017 Elsevier Inc.   Mitchell Dean- Long Term Monitor Instructions  Your physician has requested you wear a ZIO patch monitor for 14 days.  This is a single patch monitor. [employer] supplies one patch monitor per enrollment. Additional stickers are not available. Please do not apply patch if you will be having a Nuclear Stress Test,  Echocardiogram, Cardiac CT, MRI, or Chest Xray during the period you would be wearing the  monitor. The patch cannot be worn during these tests. You cannot remove and re-apply the  ZIO XT patch monitor.  Your ZIO patch monitor will be mailed 3 day USPS to your address on file. It may take 3-5 days  to receive your monitor after you have been enrolled.  Once you have received your monitor, please review the enclosed instructions. Your monitor  has already been registered assigning a specific monitor serial # to you.  Billing and Patient Assistance Program Information  We have supplied [employer] with any of your insurance information on file for billing purposes. [employer] offers a sliding scale Patient Assistance Program for patients that do not have  insurance, or whose insurance does not completely cover the cost of the ZIO monitor.  You must apply for the Patient Assistance Program  to qualify for this discounted rate.  To apply, please call [employer] at [phone], select option 4, select option 2, ask to apply for  Patient Assistance Program. Meredeth will ask your household income, and how many people  are in your household. They will quote your out-of-pocket cost based on that information.  [employer] will also be able to set up a 83-month, interest-free payment plan if needed.  Applying the monitor   Shave hair from upper left chest.  Hold abrader disc by orange tab. Rub abrader in 40 strokes over the upper left chest as  indicated in your monitor instructions.  Clean area with 4 enclosed alcohol pads. Let dry.  Apply patch as indicated in monitor instructions. Patch will be placed under collarbone on left  side of chest with arrow pointing upward.  Rub patch adhesive wings for 2 minutes. Remove white label marked 1. Remove the white  label marked 2. Rub patch adhesive wings for 2 additional minutes.  While looking in a mirror, press and release button in center of patch. A small green light will  flash 3-4 times. This will be your only indicator that the monitor has been turned on.  Do not shower for the first 24 hours. You may shower after the first 24 hours.  Press the button if you feel a symptom. You will hear a small  click. Record Date, Time and  Symptom in the Patient Logbook.  When you are ready to remove the patch, follow instructions on the last 2 pages of Patient  Logbook. Stick patch monitor onto the last page of Patient Logbook.  Place Patient Logbook in the blue and white box. Use locking tab on box and tape box closed  securely. The blue and white box has prepaid postage on it. Please place it in the mailbox as  soon as possible. Your physician should have your test results approximately 7 days after the  monitor has been mailed back to [facility].  Call [facility] Customer Care at [phone] if you have questions regarding  your ZIO XT  patch monitor. Call them immediately if you see an orange light blinking on your  monitor.  If your monitor falls off in less than 4 days, contact our Monitor department at [phone].  If your monitor becomes loose or falls off after 4 days call [employer] at [phone] for  suggestions on securing your monitor  Follow-Up: At [facility], you and your health needs are our priority.  As part of our continuing mission to provide you with exceptional heart care, our providers are all part of one team.  This team includes your primary Cardiologist (physician) and Advanced Practice Providers or APPs (Physician Assistants and Nurse Practitioners) who all work together to provide you with the care you need, when you need it.  Your next appointment:   3 month(s)  Provider:   Dorn Lesches, MD    We recommend signing up for the patient portal called MyChart.  Sign up information is provided on this After Visit Summary.  MyChart is used to connect with patients for Virtual Visits (Telemedicine).  Patients are able to view lab/test results, encounter notes, upcoming appointments, etc.  Non-urgent messages can be sent to your provider as well.   To learn more about what you can do with MyChart, go to [email or web address].

## 2024-02-12 NOTE — Assessment & Plan Note (Signed)
 Long history of tobacco abuse having smoked over 100 pack years now only smoking 1 to 4 cigarettes a day.  He does have a diagnosis of COPD and has dyspnea on exertion.  Talked about the importance of smoking cessation.

## 2024-02-12 NOTE — Progress Notes (Unsigned)
 Enrolled for [employer] to mail a ZIO XT long term holter monitor to the patients address on file.   Requested monitor to be delivered to carport, not to the front door.

## 2024-02-12 NOTE — Assessment & Plan Note (Signed)
 Patient is an A-fib today with a ventricular sponsor 126.  He is unaware that he is in A-fib.  He says he does not know how long it has been it because he does not think he has had an EKG in a long time.  His CV score is 1.  He is asymptomatic from this.  I am going to put him on low-dose beta-blocker for rate control.  I am also going to get a -2-week Zio patch to further evaluate.

## 2024-02-12 NOTE — Assessment & Plan Note (Signed)
 History of hyperlipidemia not on statin therapy with lipid profile performed by his PCP 01/09/2023 revealing a total cholesterol of 231, LDL 163 and HDL of 39.  I did offer him/suggest that we start a statin drug however he was resistant to doing so.  I am going get a coronary calcium score to her stratify.

## 2024-02-12 NOTE — Assessment & Plan Note (Signed)
 Patient has dyspnea most likely related to COPD.  I am going to get a 2D echo to further evaluate.

## 2024-02-19 ENCOUNTER — Telehealth (HOSPITAL_COMMUNITY): Payer: Self-pay | Admitting: Cardiovascular Disease

## 2024-02-19 NOTE — Telephone Encounter (Signed)
 Patient called and cancelled echocardiogram for reason below:  02/19/2024 8:03 AM Ab:[id number], Mitchell Dean  Cancel Rsn: Patient (pt called in to cancell all his appts. He states he does not wish to come back and he will be sending his zio monitor back as well)   Order will be removed from the echo WQ.

## 2024-02-20 ENCOUNTER — Other Ambulatory Visit

## 2024-02-23 DIAGNOSIS — I4819 Other persistent atrial fibrillation: Secondary | ICD-10-CM | POA: Diagnosis not present

## 2024-03-21 ENCOUNTER — Other Ambulatory Visit (HOSPITAL_COMMUNITY)

## 2024-05-01 DIAGNOSIS — K5730 Diverticulosis of large intestine without perforation or abscess without bleeding: Secondary | ICD-10-CM | POA: Diagnosis not present

## 2024-05-01 DIAGNOSIS — K635 Polyp of colon: Secondary | ICD-10-CM | POA: Diagnosis not present

## 2024-05-01 DIAGNOSIS — Z860100 Personal history of colon polyps, unspecified: Secondary | ICD-10-CM | POA: Diagnosis not present

## 2024-05-01 DIAGNOSIS — Z09 Encounter for follow-up examination after completed treatment for conditions other than malignant neoplasm: Secondary | ICD-10-CM | POA: Diagnosis not present

## 2024-05-01 DIAGNOSIS — Z9889 Other specified postprocedural states: Secondary | ICD-10-CM | POA: Diagnosis not present

## 2024-05-01 DIAGNOSIS — D125 Benign neoplasm of sigmoid colon: Secondary | ICD-10-CM | POA: Diagnosis not present

## 2024-05-14 ENCOUNTER — Ambulatory Visit: Admitting: Cardiovascular Disease

## 2024-06-19 ENCOUNTER — Encounter: Payer: Self-pay | Admitting: Pulmonary Disease

## 2024-06-19 ENCOUNTER — Ambulatory Visit: Admitting: Pulmonary Disease

## 2024-06-19 ENCOUNTER — Other Ambulatory Visit: Payer: Self-pay

## 2024-06-19 VITALS — BP 112/74 | HR 74 | Ht 67.0 in | Wt 174.0 lb

## 2024-06-19 DIAGNOSIS — F17200 Nicotine dependence, unspecified, uncomplicated: Secondary | ICD-10-CM

## 2024-06-19 DIAGNOSIS — F17210 Nicotine dependence, cigarettes, uncomplicated: Secondary | ICD-10-CM

## 2024-06-19 DIAGNOSIS — J4489 Other specified chronic obstructive pulmonary disease: Secondary | ICD-10-CM | POA: Diagnosis not present

## 2024-06-19 DIAGNOSIS — J449 Chronic obstructive pulmonary disease, unspecified: Secondary | ICD-10-CM

## 2024-06-19 DIAGNOSIS — Z87891 Personal history of nicotine dependence: Secondary | ICD-10-CM

## 2024-06-19 DIAGNOSIS — Z122 Encounter for screening for malignant neoplasm of respiratory organs: Secondary | ICD-10-CM

## 2024-06-19 NOTE — Progress Notes (Signed)
 "              Mitchell Dean    [id number]    10-30-1955  Primary Care Physician:Smith, Alberta, MD  Referring Physician: Claudene Alberta, MD [phone] [address] [place],  [place] [address]  Chief complaint: Consult for COPD  HPI: 68 y.o. who  has a past medical history of Anxiety, Aortic atherosclerosis, Arthritis, Colon adenomas, Constipation, COPD (chronic obstructive pulmonary disease) (HCC), Depression, ED (erectile dysfunction), History of cocaine abuse (HCC), HLD (hyperlipidemia), Insomnia, Mild acid reflux, Mild emphysema (HCC), Personal history of hyperplastic colon polyps, Prolapsed internal hemorrhoids, and Tobacco abuse.  Discussed the use of AI scribe software for clinical note transcription with the patient, who gave verbal consent to proceed.  History of Present Illness Mitchell Dean is a 68 year old male with COPD and asthma who presents for pulmonary follow-up. He was previously under the care of Dr. Shellia.  Dyspnea and exercise tolerance - Shortness of breath with exertion, particularly when walking uphill - Tolerates walking on flat surfaces and slow jogging within his limits - No recent need for albuterol  inhaler  Inhaler use and medication response - Uses Trelegy inhaler only as needed, mainly with exertion - Previously used Chantix  and nicotine  patches for smoking cessation but discontinued due to jitteriness and insomnia  Pulmonary imaging surveillance - Undergoes chest CT approximately every two years - Most recent chest CT performed in October 2024    Relevant Pulmonary history: Pets: Occupation: Exposures: No h/o chemo/XRT/amiodarone/macrodantin/MTX  No exposure to asbestos, silica or other organic allergens  Smoking history: 80-pack-year smoker.  Still smokes 1 to 2 cigarettes/day Travel history: Family history:   Outpatient Encounter Medications as of 06/19/2024  Medication Sig   albuterol  (PROVENTIL  HFA;VENTOLIN  HFA) 108 (90 Base)  MCG/ACT inhaler Inhale 2 puffs into the lungs every 4 (four) hours as needed for wheezing or shortness of breath.   ALPRAZolam  (XANAX ) 1 MG tablet Take 1 mg by mouth 3 (three) times daily as needed for anxiety.   aspirin EC 81 MG tablet Take 81 mg by mouth daily.   FLUoxetine  (PROZAC ) 20 MG capsule Take 20 mg by mouth daily.   Fluticasone -Umeclidin-Vilant (TRELEGY ELLIPTA) 100-62.5-25 MCG/INH AEPB Inhale 1 puff into the lungs daily.   metoprolol  succinate (TOPROL  XL) 25 MG 24 hr tablet Take 0.5 tablets (12.5 mg total) by mouth daily.   Multiple Vitamin (MULTIVITAMIN) tablet Take 1 tablet by mouth daily.   zolpidem  (AMBIEN ) 10 MG tablet Take 10 mg by mouth at bedtime.   No facility-administered encounter medications on file as of 06/19/2024.     Physical Exam: Today's Vitals   06/19/24 1516  TempSrc: Oral  Weight: 174 lb (78.9 kg)  Height: 5' 7 (1.702 m)   Body mass index is 27.25 kg/m.  Physical Exam GEN: No acute distress. CV: Regular rate and rhythm, no murmurs. LUNGS: Clear to auscultation bilaterally, normal respiratory effort. SKIN JOINTS: Warm and dry, no rash.    Data Reviewed: Imaging: CT chest 04/14/2023-mild emphysema.  No lung nodule or mass I reviewed the images personally.  PFTs: 04/29/2020 FVC 3.38 [80%], FEV1 2.41 [77%], F/F 72, TLC 7.08 [108%], DLCO 21.11 [85%] No obstruction, positive bronchodilator response with air trapping.  Normal diffusion capacity  Labs:  Assessment and Plan Assessment & Plan Chronic obstructive pulmonary disease and asthma COPD and asthma are well-controlled with Trelegy inhaler as needed. Experiences dyspnea on exertion, particularly on inclines, but manages well on flat surfaces. Not using  albuterol  currently. Smoking history of 50 years, currently smoking 1-3 cigarettes per day. Previous attempts to quit using Chantix  and nicotine  patches were unsuccessful due to side effects. - Encouraged daily use of Trelegy inhaler for  optimal control. - Discussed smoking cessation options, including nicotine  patches and Chantix , noting previous adverse effects with Chantix .  Nicotine  dependence Long-standing nicotine  dependence with a 50-year smoking history. Currently smoking 1-3 cigarettes per day, reduced from a pack a day. Previous cessation attempts with Chantix  and nicotine  patches were unsuccessful due to side effects. Acknowledges the need to quit but feels it is unlikely to happen soon.  Time spent counseling-5 minutes.  Reassess at return visit. - Discussed potential benefits of quitting smoking and offered support for cessation efforts.  Lung cancer screening Due for annual lung cancer screening CT scan. Last scan was in October 2024. Importance of annual screening emphasized due to extensive smoking history. He expressed desire to know results and was informed of the need for annual screening to catch lung nodules early. - Coordinated with lung cancer screening team to schedule annual CT scan. - Ensured he receives a call to schedule the CT scan.  Pulmonary function test monitoring Pulmonary function test last conducted in 2021. Monitoring is necessary to assess lung function over time. - Will schedule pulmonary function test before next visit in six months.  Recommendations: Continue Trelegy Smoking cessation Resume lung cancer screening  Lonna Coder MD [facility] 06/19/2024, 3:17 PM  CC: Claudene Pellet, MD   "

## 2024-06-19 NOTE — Patient Instructions (Signed)
°  VISIT SUMMARY: You had a follow-up visit to discuss your COPD and asthma management. We reviewed your symptoms, inhaler use, smoking history, and the need for regular lung cancer screening and pulmonary function tests.  YOUR PLAN: CHRONIC OBSTRUCTIVE PULMONARY DISEASE AND ASTHMA: Your COPD and asthma are well-controlled with your current inhaler, but you experience shortness of breath when exerting yourself, especially on inclines. -Use your Trelegy inhaler daily for better control of your symptoms. -We discussed smoking cessation options, including nicotine  patches and Chantix , but noted your previous side effects with Chantix .  NICOTINE  DEPENDENCE: You have a long history of smoking and currently smoke 1-3 cigarettes per day. You have tried to quit before but experienced side effects from the medications. -We talked about the benefits of quitting smoking and I offered support for your efforts to quit.  LUNG CANCER SCREENING: You are due for your annual lung cancer screening CT scan, which is important due to your extensive smoking history. -We will coordinate with the lung cancer screening team to schedule your annual CT scan. -You will receive a call to schedule the CT scan.  PULMONARY FUNCTION TEST MONITORING: Your last pulmonary function test was in 2021, and it is important to monitor your lung function over time. -We will schedule a pulmonary function test before your next visit in six months.                   Contains text generated by [employer]bridge.

## 2024-06-20 ENCOUNTER — Telehealth: Payer: Self-pay

## 2024-06-20 NOTE — Progress Notes (Signed)
 LVM to schedule overdue annual Lung CT.

## 2024-06-20 NOTE — Telephone Encounter (Signed)
 LVM and mailed letter to schedule annual Lung CT. Previous attempts made, no response. Dr. Theophilus requested additional outreach.

## 2024-10-14 ENCOUNTER — Ambulatory Visit: Admitting: Cardiovascular Disease

## 2024-12-23 ENCOUNTER — Ambulatory Visit: Admitting: Pulmonary Disease

## 2024-12-23 ENCOUNTER — Encounter
